# Patient Record
Sex: Female | Born: 1960 | Race: Black or African American | Hispanic: No | Marital: Married | Smoking: Never smoker
Health system: Southern US, Community
[De-identification: ages and names within clinical notes are randomized; demographics above are authoritative.]

## PROBLEM LIST (undated history)

## (undated) DIAGNOSIS — R079 Chest pain, unspecified: Secondary | ICD-10-CM

## (undated) DIAGNOSIS — I10 Essential (primary) hypertension: Secondary | ICD-10-CM

## (undated) DIAGNOSIS — K7689 Other specified diseases of liver: Secondary | ICD-10-CM

## (undated) DIAGNOSIS — Z531 Procedure and treatment not carried out because of patient's decision for reasons of belief and group pressure: Secondary | ICD-10-CM

## (undated) DIAGNOSIS — I341 Nonrheumatic mitral (valve) prolapse: Secondary | ICD-10-CM

## (undated) DIAGNOSIS — IMO0001 Reserved for inherently not codable concepts without codable children: Secondary | ICD-10-CM

## (undated) DIAGNOSIS — M8580 Other specified disorders of bone density and structure, unspecified site: Secondary | ICD-10-CM

## (undated) HISTORY — DX: Nonrheumatic mitral (valve) prolapse: I34.1

## (undated) HISTORY — PX: WISDOM TOOTH EXTRACTION: SHX21

## (undated) HISTORY — DX: Reserved for inherently not codable concepts without codable children: IMO0001

## (undated) HISTORY — DX: Procedure and treatment not carried out because of patient's decision for reasons of belief and group pressure: Z53.1

## (undated) HISTORY — DX: Other specified disorders of bone density and structure, unspecified site: M85.80

---
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
## 1998-09-06 ENCOUNTER — Ambulatory Visit (HOSPITAL_COMMUNITY): Admission: RE | Admit: 1998-09-06 | Discharge: 1998-09-06 | Payer: Self-pay | Admitting: Family Medicine

## 2001-03-23 ENCOUNTER — Other Ambulatory Visit: Admission: RE | Admit: 2001-03-23 | Discharge: 2001-03-23 | Payer: Self-pay | Admitting: Obstetrics and Gynecology

## 2002-05-11 ENCOUNTER — Encounter: Admission: RE | Admit: 2002-05-11 | Discharge: 2002-05-11 | Payer: Self-pay | Admitting: Family Medicine

## 2002-05-11 ENCOUNTER — Encounter: Payer: Self-pay | Admitting: Family Medicine

## 2003-05-02 ENCOUNTER — Encounter: Payer: Self-pay | Admitting: Obstetrics and Gynecology

## 2003-05-02 ENCOUNTER — Encounter: Admission: RE | Admit: 2003-05-02 | Discharge: 2003-05-02 | Payer: Self-pay | Admitting: Obstetrics and Gynecology

## 2003-05-17 ENCOUNTER — Encounter: Admission: RE | Admit: 2003-05-17 | Discharge: 2003-05-17 | Payer: Self-pay | Admitting: Family Medicine

## 2003-05-17 ENCOUNTER — Encounter: Payer: Self-pay | Admitting: Family Medicine

## 2003-06-09 ENCOUNTER — Encounter: Admission: RE | Admit: 2003-06-09 | Discharge: 2003-06-09 | Payer: Self-pay | Admitting: Family Medicine

## 2003-09-14 ENCOUNTER — Encounter: Admission: RE | Admit: 2003-09-14 | Discharge: 2003-09-14 | Payer: Self-pay | Admitting: Family Medicine

## 2003-12-06 ENCOUNTER — Ambulatory Visit (HOSPITAL_COMMUNITY): Admission: RE | Admit: 2003-12-06 | Discharge: 2003-12-06 | Payer: Self-pay | Admitting: Gastroenterology

## 2004-03-28 ENCOUNTER — Encounter: Admission: RE | Admit: 2004-03-28 | Discharge: 2004-03-28 | Payer: Self-pay | Admitting: Family Medicine

## 2004-06-27 ENCOUNTER — Encounter: Admission: RE | Admit: 2004-06-27 | Discharge: 2004-06-27 | Payer: Self-pay | Admitting: Obstetrics and Gynecology

## 2005-07-17 ENCOUNTER — Encounter: Admission: RE | Admit: 2005-07-17 | Discharge: 2005-07-17 | Payer: Self-pay | Admitting: Obstetrics and Gynecology

## 2005-09-28 ENCOUNTER — Emergency Department (HOSPITAL_COMMUNITY): Admission: EM | Admit: 2005-09-28 | Discharge: 2005-09-28 | Payer: Self-pay | Admitting: Emergency Medicine

## 2006-02-14 ENCOUNTER — Other Ambulatory Visit: Admission: RE | Admit: 2006-02-14 | Discharge: 2006-02-14 | Payer: Self-pay | Admitting: Family Medicine

## 2006-06-26 ENCOUNTER — Encounter: Admission: RE | Admit: 2006-06-26 | Discharge: 2006-06-26 | Payer: Self-pay | Admitting: Family Medicine

## 2006-07-18 ENCOUNTER — Encounter: Admission: RE | Admit: 2006-07-18 | Discharge: 2006-07-18 | Payer: Self-pay | Admitting: Family Medicine

## 2007-06-22 ENCOUNTER — Encounter: Admission: RE | Admit: 2007-06-22 | Discharge: 2007-06-22 | Payer: Self-pay | Admitting: Family Medicine

## 2007-07-21 ENCOUNTER — Encounter: Admission: RE | Admit: 2007-07-21 | Discharge: 2007-07-21 | Payer: Self-pay | Admitting: Family Medicine

## 2007-11-27 HISTORY — PX: VAGINAL HYSTERECTOMY: SUR661

## 2007-12-14 ENCOUNTER — Encounter: Payer: Self-pay | Admitting: Gynecology

## 2007-12-14 ENCOUNTER — Ambulatory Visit (HOSPITAL_BASED_OUTPATIENT_CLINIC_OR_DEPARTMENT_OTHER): Admission: RE | Admit: 2007-12-14 | Discharge: 2007-12-15 | Payer: Self-pay | Admitting: Gynecology

## 2008-06-05 ENCOUNTER — Encounter: Admission: RE | Admit: 2008-06-05 | Discharge: 2008-06-05 | Payer: Self-pay | Admitting: Family Medicine

## 2008-07-27 ENCOUNTER — Encounter: Admission: RE | Admit: 2008-07-27 | Discharge: 2008-07-27 | Payer: Self-pay | Admitting: Family Medicine

## 2009-01-10 ENCOUNTER — Other Ambulatory Visit: Admission: RE | Admit: 2009-01-10 | Discharge: 2009-01-10 | Payer: Self-pay | Admitting: Gynecology

## 2009-01-10 ENCOUNTER — Ambulatory Visit: Payer: Self-pay | Admitting: Gynecology

## 2009-01-10 ENCOUNTER — Encounter: Payer: Self-pay | Admitting: Gynecology

## 2009-08-16 ENCOUNTER — Encounter: Admission: RE | Admit: 2009-08-16 | Discharge: 2009-08-16 | Payer: Self-pay | Admitting: Gynecology

## 2010-01-11 ENCOUNTER — Ambulatory Visit: Payer: Self-pay | Admitting: Gynecology

## 2010-01-11 ENCOUNTER — Other Ambulatory Visit: Admission: RE | Admit: 2010-01-11 | Discharge: 2010-01-11 | Payer: Self-pay | Admitting: Gynecology

## 2010-06-28 DIAGNOSIS — M8580 Other specified disorders of bone density and structure, unspecified site: Secondary | ICD-10-CM

## 2010-06-28 HISTORY — DX: Other specified disorders of bone density and structure, unspecified site: M85.80

## 2010-09-05 ENCOUNTER — Other Ambulatory Visit: Payer: Self-pay | Admitting: Gynecology

## 2010-09-05 DIAGNOSIS — Z1231 Encounter for screening mammogram for malignant neoplasm of breast: Secondary | ICD-10-CM

## 2010-09-10 ENCOUNTER — Ambulatory Visit
Admission: RE | Admit: 2010-09-10 | Discharge: 2010-09-10 | Disposition: A | Discharge status: Home / Self Care | Payer: BLUE CROSS/BLUE SHIELD | Source: Ambulatory Visit | Attending: Gynecology | Admitting: Gynecology

## 2010-09-10 DIAGNOSIS — Z1231 Encounter for screening mammogram for malignant neoplasm of breast: Secondary | ICD-10-CM

## 2010-12-11 NOTE — Op Note (Signed)
NAME:  Kathy Douglas, Kathy Douglas                ACCOUNT NO.:  [id number]   MEDICAL RECORD NO.:  [id number]          PATIENT TYPE:  AMB   LOCATION:  [facility]                         FACILITY:  [facility]   PHYSICIAN:  Timothy P. Fontaine, M.D.DATE OF BIRTH:  [birth year]   DATE OF PROCEDURE:  12/14/2007  DATE OF DISCHARGE:                               OPERATIVE REPORT   PREOPERATIVE DIAGNOSES:  1. Leiomyoma.  2. menorrhagia.  3. Dysmenorrhea.  4. Dyspareunia.   POSTOPERATIVE DIAGNOSES:  1. Leiomyoma.  2. menorrhagia.  3. Dysmenorrhea.  4. Dyspareunia.   PROCEDURE:  Laparoscopically-assisted vaginal hysterectomy and bilateral  salpingo-oophorectomy.   SURGEON:  Timothy P. Fontaine, M.D.   ASSISTANTGaetano Hawthorne. Lily Peer, M.D.   ANESTHETIC:  General.   ESTIMATED BLOOD LOSS:  200 mL.   COMPLICATIONS:  None.   SPECIMENS:  Uterus, right and left fallopian tube segments, right and  left ovaries to pathology.   FINDINGS:  EUA, external BUS and vagina normal.  Cervix grossly normal.  Uterus bulky, approximately 12-week size and irregular.  Adnexa without  gross masses.  Surgical uterus enlarged with multiple myomas.  The right  and left fallopian tube segments were grossly normal.  Right and left  ovaries were grossly normal.  Pelvis otherwise unremarkable evidence of  adhesive disease or endometriosis.  Upper abdominal exam was grossly  normal.   PROCEDURE:  The patient was taken to the operating room; underwent  general endotracheal anesthesia.  She was placed in the low dorsal  lithotomy position, received an abdominal perineal vaginal preparation  with Betadine solution.  EUA performed.  Hulka tenaculum placed on the  cervix and an indwelling Foley catheterization placed in sterile  technique.  The patient was draped in the usual fashion.  A repeat  transverse infraumbilical incision was made.  The 10-mm OptiVu trocar  with laparoscope was placed under direct visualization without  difficulty, and the abdomen was subsequently insufflated.  Right and  left 5-mm suprapubic ports were then placed under direct visualization,  after transillumination for the vessels without difficulty.  Examination  of pelvic organs and upper abdominal exam was carried out with findings  noted above.  The left infundibulopelvic ligament and vessels were  identified.  The pelvic sidewall anatomy reviewed; the ureter found to  be away from the operative field.  The infundibulopelvic ligament and  vessels were transected using the harmonic scalpel without difficulty.  The left adnexa was progressively freed from its attachments through  transection with the harmonic scalpel, to the level of the round  ligament.  The round ligament was then transected and the anterior  vesicouterine peritoneal fold was sharply developed with the harmonic  scalpel to the midline.  A similar procedure was then carried out on the  other side, meeting the midline peritoneal incision anteriorly.  The  parametrial tissues were skeletonized down to the level of the uterine  vessels, which were left intact.   Attention was then turned to the vaginal portion of the procedure.  The  patient was then placed in the high lithotomy position.  A weighted  speculum  was placed.  The cervix was visualized.  The Hulka tenaculum  removed and replaced with a Jacobs tenaculum.  The cervical mucosa was  then circumferentially injected using lidocaine and epinephrine mixture.  The cervical mucosa was then sharply incised circumferentially and the  paracervical planes were sharply developed.  The anterior cul-de-sac was  then entered, and subsequently the posterior cul-de-sac was entered  without difficulty and a long, weighted speculum placed.  The right and  left uterosacral ligaments were then identified, clamped, cut and  ligated using zero Vicryl suture; tagged for future reference.  The  uterus was progressively freed from its  attachments through clamping,  cutting and ligating of the paracervical cardinal ligaments and  parametrial tissues, using zero Vicryl suture to the level of the  uterine vessels; which were identified and doubly ligated and  transected.  Due to the size of the uterus, it was necessary to  morcellate the uterus to remove it vaginally.  The cervix was cored and  removed, and subsequent morcellation of the uterus allowed delivery of  the remaining segments.  The remaining parametrial attachments were  clamped, cut and ligated using zero Vicryl suture, and the specimen was  removed in its entirety.  The long weighted speculum was replaced with a  short weighted speculum.  The posterior vaginal cuff was grasped with an  Allis clamp.  The intestines were packed from the operative field with a  moist sponge tape.  Then the posterior vaginal mucosa cuff region was  run from uterosacral ligament to uterosacral ligament using zero Vicryl  suture in a running interlocking stitch.  The pelvis was irrigated.  Hemostasis was visualized.  The sponge was removed.  The vagina was  closed anterior to posterior using zero Vicryl suture in interrupted  figure-of-eight stitch.  The vagina was irrigated.  Hemostasis  visualized.   Attention was then returned to the abdominal portion.  The  patient was  placed in the low dorsal lithotomy position; the operating team  regloved.  The abdomen was reinsufflated and the pelvis was copiously  irrigated; showing adequate hemostasis at all surgical sites and  pedicles.  The gas was slowly allowed to escape.  The right and left 5-  mm ports were removed.  Hemostasis again was visualized at the surgical  sites and the port sites under direct visualization under low pressure  situation.  The infraumbilical port was then backed out under direct  visualization, showing adequate hemostasis and no evidence of hernia  formation.  A zero Vicryl interrupted subcutaneous  fascial stitch was  placed infraumbilically.  All skin and sites were injected using 0.25%  Marcaine.  All skin incisions were closed using 4-0 plain interrupted  cuticular stitch.  Sterile dressings were applied.  The patient placed  in the supine position and awakened without difficulty.  She was taken  to the recovery room in good condition, having tolerated the procedure  well, noting clear yellow urine.      Timothy P. Fontaine, M.D.  Electronically Signed     TPF/MEDQ  D:  12/14/2007  T:  12/14/2007  Job:  [id number]

## 2010-12-11 NOTE — Discharge Summary (Signed)
NAME:  KONNER, WARRIOR                ACCOUNT NO.:  [id number]   MEDICAL RECORD NO.:  [id number]          PATIENT TYPE:  AMB   LOCATION:  [facility]                         FACILITY:  [facility]   PHYSICIAN:  Timothy P. Fontaine, M.D.DATE OF BIRTH:  August 01, 1960   DATE OF ADMISSION:  12/14/2007  DATE OF DISCHARGE:  12/15/2007                               DISCHARGE SUMMARY   DISCHARGE DIAGNOSES:  1. Leiomyoma.  2. Menorrhagia.  3. Dysmenorrhea.  4. Dyspareunia.   PROCEDURE:  Laparoscopically-assisted vaginal hysterectomy, bilateral  salpingo-oophorectomy, Dec 14, 2007, pathology pending.   HOSPITAL COURSE:  A 50 year old female with leiomyoma, menorrhagia,  dysmenorrhea, dyspareunia.  Underwent uncomplicated LAVH-BSO Dec 14, 2007.  The patient's postoperative course was uncomplicated and she was  discharged home postoperative day #1 ambulating well, tolerating a  regular diet, voiding without difficulty.  The patient's preoperative  hemoglobin was 12, her postoperative hemoglobin was 10.  She received  precautions, instructions and follow-up.  Will be seen in the office 2  weeks following discharge and already has pain medications of Tylox at  home and will take 1-2 p.o. q.4-6h. p.r.n. pain.      Timothy P. Fontaine, M.D.  Electronically Signed     TPF/MEDQ  D:  12/15/2007  T:  12/15/2007  Job:  [id number]

## 2010-12-11 NOTE — H&P (Signed)
NAME:  Kathy Douglas, Kathy Douglas                ACCOUNT NO.:  [id number]   MEDICAL RECORD NO.:  [id number]          PATIENT TYPE:  AMB   LOCATION:  [facility]                         FACILITY:  [facility]   PHYSICIAN:  Timothy P. Fontaine, M.D.DATE OF BIRTH:  07-31-1960   DATE OF ADMISSION:  12/14/2007  DATE OF DISCHARGE:                              HISTORY & PHYSICAL   CHIEF COMPLAINT:  Menorrhagia, iron deficiency anemia, leiomyoma,  dysmenorrhea, dyspareunia.   HISTORY OF PRESENT ILLNESS:  A 50 year old G2, P2 female status post  tubal sterilization, history of leiomyoma, menorrhagia, iron deficiency  anemia, dyspareunia, dysmenorrhea for LAVH BSO.  She has been found to  have a 10-12 week size uterus on exam, a negative endometrial biopsy,  and a negative sonohystogram for intracavitary abnormalities.  Options  for management were reviewed with the patient to include hormonal  suppression, Marina IUD, endometrial ablation, uterine artery  embolization, hysterectomy, TAH, LAVH, TVH were all reviewed.  She is  admitted for LAVH BSO.  She has been on Lupron suppression to allow for  hemoglobin recovery.  She initially had a hemoglobin of 9, and her last  hemoglobin was 11.8.  Her most recent is pending at the time of this  dictation.   PAST MEDICAL HISTORY:  Uncomplicated.   PAST SURGICAL HISTORY:  Tubal sterilization.   ALLERGIES:  NO KNOWN DRUG ALLERGIES.   CURRENT MEDICATIONS:  Iron.   SOCIAL HISTORY:  Significant for Jehovah's Witness, refuses blood  products.   FAMILY HISTORY:  Noncontributory.   ADMISSION PHYSICAL EXAMINATION:  VITAL SIGNS:  Afebrile, vital signs  stable.  HEENT:  Normal.  LUNGS:  Clear.  CARDIAC:  Regular rate.  No rubs, murmurs, or gallops.  ABDOMEN:  Benign.  PELVIC:  External BUS, vagina normal.  Cervix normal.  Uterus bulky, 10-  week size, irregular, consistent with leiomyoma.  Adnexa without gross  masses.   ASSESSMENT/PLAN:  A 50 year old gravidaida 1, para 2  female status post  tubal sterilization, leiomyoma, dysmenorrhea, menorrhagia, dyspareunia  for laparoscopic-assisted vaginal hysterectomy with bilateral salpingo-  oophorectomy.  I reviewed the proposed surgery with the patient, the  alternatives, nonsurgical as well as minimally invasive possibilities  including hormonal manipulation, intrauterine device, endometrial  ablation, uterine artery embolization, hysterectomy, myomectomy  reviewed.  She wants to proceed with hysterectomy, bilateral salpingo-  oophorectomy.  I reviewed the ovarian conservation issue, the options of  keeping both ovaries for continued hormone production versus removing  her ovaries were reviewed.  She wants to have both ovaries removed.  I  discussed the issues of hypoestrogenism both from a symptomatic  standpoint as well as a prophylactic cardiovascular standpoint.  The  potential for estrogen replacement therapy was also reviewed and the  [facility] study with its risks, benefits discussed to  include breast cancer, stroke, heart attack, deep venous thrombosis  issues.  The patient, after careful consideration, wants to have both  ovaries removed.  Absolute irreversible sterility associated with  hysterectomy was discussed, understood, and accepted.  Sexuality  following hysterectomy was also reviewed, the potential for orgasmic  dysfunction as well  as persistent dyspareunia was discussed, understood,  and accepted.  She understands the laparoscopic approach, multiple port  site trocar placement, insufflation, use of technologies such as  electrocautery, Harmonic scalpel was reviewed.  The potential for a  larger incision if complications are encountered or if it is felt unsafe  to proceed with the laparoscopic-assisted vaginal hysterectomy approach  was discussed, understood, and accepted.  The risks of infection  requiring prolonged antibiotics as well as abscess formation, opening  and  draining of incisions, abscess formation drainage, reoperation was  all discussed, understood, and accepted.  The long-term issues with  incisions reviewed to include cosmetics, keloid, as well as hernia  formation was all discussed, understood, and accepted.  We again  reviewed on several occasions the issues of her refusal for blood  products.  She will use albumin and non-human alternatives but will not  use platelets or red blood cells.  She will bring and have the  appropriate paperwork with her at the time of surgery.  The risk of  inadvertent injury to internal organs during the procedure to include  bowel, bladder, ureters, vessels and nerves either immediately  recognized or delay recognized, necessitating major exploratory  reparative surgeries, future reparative surgeries, bowel resection,  bladder repair, ureteral surgery, reimplantation, ostomy formation was  all discussed, understood, and accepted.  The patient's questions were  answered to her satisfaction.  She had no questions, and she is ready to  proceed with surgery.      Timothy P. Fontaine, M.D.  Electronically Signed     TPF/MEDQ  D:  12/10/2007  T:  12/10/2007  Job:  [id number]

## 2010-12-14 NOTE — Op Note (Signed)
NAME:  Kathy Douglas, Kathy Douglas                          ACCOUNT NO.:  [id number]   MEDICAL RECORD NO.:  [id number]                   PATIENT TYPE:  AMB   LOCATION:  ENDO                                 FACILITY:  [facility]   PHYSICIAN:  John C. Madilyn Fireman, M.D.                 DATE OF BIRTH:  1961-02-16   DATE OF PROCEDURE:  12/06/2003  DATE OF DISCHARGE:                                 OPERATIVE REPORT   PROCEDURE:  Colonoscopy.   INDICATIONS FOR PROCEDURE:  Chronic left-sided abdominal pain, occasional  rectal bleeding with CT scan suggesting thickening of the descending colon  and splenic flexure area, possibly diverticulitis.   PROCEDURE:  The patient was placed in the left lateral decubitus position  and placed on the pulse monitor with continuous low flow oxygen delivered by  nasal cannula.  She was sedated with 50 mcg IV fentanyl and 6 mg IV Versed.  The video colonoscope was inserted into the rectum and advanced to the  cecum.  Confirmed by transillumination at McBurney's point and visualization  of the ileocecal valve and appendiceal orifice.  Prep is excellent.  The  cecum, ascending, transverse, descending, and sigmoid colon all appeared  normal with no masses, polyps, diverticula, or other mucosal abnormalities.  The rectum likewise appeared normal, and retroflexed view of the anus  revealed only minimal internal hemorrhoids.  The scope was then withdrawn,  and the patient returned to the recovery room in stable condition.  She  tolerated the procedure well, and there were no immediate complications.   IMPRESSION:  Small internal hemorrhoids, otherwise normal study.   PLAN:  1. Treat the hemorrhoids conservatively.  2. Consider ____________.                                               John C. Madilyn Fireman, M.D.    JCH/MEDQ  D:  12/06/2003  T:  12/06/2003  Job:  [id number]   cc:   [place]ale [place]. Andrey Campanile, M.D.  [address]  [place]  [place] [address]  Fax: [phone]

## 2011-01-14 ENCOUNTER — Encounter (INDEPENDENT_AMBULATORY_CARE_PROVIDER_SITE_OTHER): Payer: BLUE CROSS/BLUE SHIELD | Admitting: Gynecology

## 2011-01-14 ENCOUNTER — Other Ambulatory Visit: Payer: Self-pay | Admitting: Gynecology

## 2011-01-14 ENCOUNTER — Other Ambulatory Visit (HOSPITAL_COMMUNITY)
Admission: RE | Admit: 2011-01-14 | Discharge: 2011-01-14 | Disposition: A | Discharge status: Home / Self Care | Payer: BLUE CROSS/BLUE SHIELD | Source: Ambulatory Visit | Attending: Gynecology | Admitting: Gynecology

## 2011-01-14 DIAGNOSIS — Z13220 Encounter for screening for lipoid disorders: Secondary | ICD-10-CM

## 2011-01-14 DIAGNOSIS — Z01419 Encounter for gynecological examination (general) (routine) without abnormal findings: Secondary | ICD-10-CM

## 2011-01-14 DIAGNOSIS — Z833 Family history of diabetes mellitus: Secondary | ICD-10-CM

## 2011-01-14 DIAGNOSIS — Z124 Encounter for screening for malignant neoplasm of cervix: Secondary | ICD-10-CM | POA: Insufficient documentation

## 2011-04-24 LAB — CBC
Hemoglobin: 10.1 — ABNORMAL LOW
MCHC: 33.4
RBC: 3.37 — ABNORMAL LOW

## 2011-04-24 LAB — DIFFERENTIAL
Basophils Relative: 1
Lymphocytes Relative: 10 — ABNORMAL LOW
Monocytes Absolute: 0.3
Monocytes Relative: 4
Neutro Abs: 8.3 — ABNORMAL HIGH

## 2011-10-08 ENCOUNTER — Other Ambulatory Visit: Payer: Self-pay | Admitting: Gynecology

## 2011-10-08 DIAGNOSIS — Z1231 Encounter for screening mammogram for malignant neoplasm of breast: Secondary | ICD-10-CM

## 2011-10-18 ENCOUNTER — Ambulatory Visit
Admission: RE | Admit: 2011-10-18 | Discharge: 2011-10-18 | Disposition: A | Discharge status: Home / Self Care | Payer: BC Managed Care – PPO | Source: Ambulatory Visit | Attending: Gynecology | Admitting: Gynecology

## 2011-10-18 DIAGNOSIS — Z1231 Encounter for screening mammogram for malignant neoplasm of breast: Secondary | ICD-10-CM

## 2012-01-15 ENCOUNTER — Ambulatory Visit (INDEPENDENT_AMBULATORY_CARE_PROVIDER_SITE_OTHER): Payer: BC Managed Care – PPO | Admitting: Gynecology

## 2012-01-15 ENCOUNTER — Encounter: Payer: Self-pay | Admitting: Gynecology

## 2012-01-15 VITALS — BP 120/74 | Ht 67.5 in | Wt 125.0 lb

## 2012-01-15 DIAGNOSIS — Z01419 Encounter for gynecological examination (general) (routine) without abnormal findings: Secondary | ICD-10-CM

## 2012-01-15 DIAGNOSIS — Z79890 Hormone replacement therapy (postmenopausal): Secondary | ICD-10-CM

## 2012-01-15 MED ORDER — ESTRADIOL 0.05 MG/24HR TD PTTW: 1.0000 | MEDICATED_PATCH | TRANSDERMAL | Status: DC

## 2012-01-15 NOTE — Patient Instructions (Signed)
Continue on estrogen patches as discussed. Follow up in one year for annual exam.

## 2012-01-15 NOTE — Progress Notes (Signed)
Kathy Douglas November 11, 1960 [id number]        51 y.o.  for annual exam.  Several issues noted below.  Past medical history,surgical history, medications, allergies, family history and social history were all reviewed and documented in the EPIC chart. ROS:  Was performed and pertinent positives and negatives are included in the history.  Exam: Sherrilyn Rist chaperone present Filed Vitals:   01/15/12 1455  BP: 120/74   General appearance  Normal Skin grossly normal Head/Neck normal with no cervical or supraclavicular adenopathy thyroid normal Lungs  clear Cardiac RR, without RMG Abdominal  soft, nontender, without masses, organomegaly or hernia Breasts  examined lying and sitting without masses, retractions, discharge or axillary adenopathy. Pelvic  Ext/BUS/vagina  normal   Adnexa  Without masses or tenderness    Anus and perineum  normal   Rectovaginal  normal sphincter tone without palpated masses or tenderness.    Assessment/Plan:  51 y.o. female for annual exam.    1. HRT. Patient is on Vivelle dot 0.05 doing well. The issues of weaning and stopping now versus continuing were reviewed. Patient prefers to continue which I think is reasonable through another year or 2. Risks of HRT to include WHI study, increased risk of stroke, heart attack, DVT and breast cancer were reviewed. The ACOG and NAMS statements the lowest dose for shortess period of time reviewed. I refilled her Vivelle dot 0.05 mg. 2. Mammogram. Patient had her mammogram this year we'll continue with annual mammography. SBE monthly reviewed. 3. Pap smear. Last Pap smear 2012. No Pap done today. She has several normal reports chart. I reviewed current screening guidelines and she is status post hysterectomy for benign indications and the options to stop altogether were reviewed. We'll readdress this on an annual basis. 4. Colonoscopy. Patient has had a colonoscopy already in due to be repeated in several years and she'll follow up for  that when she is due. 5. Bone density. She's never had a bone density. Given her young age and being on ERT will plan screening later around age 46. Increase calcium vitamin D reviewed. 6. Health maintenance. Patient has several years and aerobic worth of normal lipid profiles CBCs and glucoses and no blood work was ordered today. A urinalysis was ordered. Assuming she continues well from a gynecologic standpoint she will see me in a year, sooner as needed.    Dara Lords MD, 3:24 PM 01/15/2012

## 2012-01-16 LAB — URINALYSIS W MICROSCOPIC + REFLEX CULTURE
Bacteria, UA: NONE SEEN
Bilirubin Urine: NEGATIVE
Casts: NONE SEEN
Crystals: NONE SEEN
Glucose, UA: NEGATIVE mg/dL
Ketones, ur: NEGATIVE mg/dL
Specific Gravity, Urine: 1.012 (ref 1.005–1.030)
pH: 7 (ref 5.0–8.0)

## 2012-10-19 ENCOUNTER — Other Ambulatory Visit: Payer: Self-pay

## 2012-10-19 DIAGNOSIS — Z1231 Encounter for screening mammogram for malignant neoplasm of breast: Secondary | ICD-10-CM

## 2012-11-06 ENCOUNTER — Ambulatory Visit
Admission: RE | Admit: 2012-11-06 | Discharge: 2012-11-06 | Disposition: A | Discharge status: Home / Self Care | Payer: Self-pay | Source: Ambulatory Visit

## 2012-11-06 DIAGNOSIS — Z1231 Encounter for screening mammogram for malignant neoplasm of breast: Secondary | ICD-10-CM

## 2013-01-18 ENCOUNTER — Encounter: Payer: Self-pay | Admitting: Gynecology

## 2013-01-19 ENCOUNTER — Encounter: Payer: Self-pay | Admitting: Gynecology

## 2013-02-03 ENCOUNTER — Encounter: Payer: Self-pay | Admitting: Gynecology

## 2013-02-03 ENCOUNTER — Ambulatory Visit (INDEPENDENT_AMBULATORY_CARE_PROVIDER_SITE_OTHER): Payer: BC Managed Care – PPO | Admitting: Gynecology

## 2013-02-03 VITALS — BP 120/80 | Ht 67.0 in | Wt 125.0 lb

## 2013-02-03 DIAGNOSIS — Z01419 Encounter for gynecological examination (general) (routine) without abnormal findings: Secondary | ICD-10-CM

## 2013-02-03 DIAGNOSIS — N951 Menopausal and female climacteric states: Secondary | ICD-10-CM

## 2013-02-03 DIAGNOSIS — Z13220 Encounter for screening for lipoid disorders: Secondary | ICD-10-CM

## 2013-02-03 DIAGNOSIS — Z79890 Hormone replacement therapy (postmenopausal): Secondary | ICD-10-CM

## 2013-02-03 LAB — CBC WITH DIFFERENTIAL/PLATELET
Basophils Absolute: 0 10*3/uL (ref 0.0–0.1)
Basophils Relative: 0 % (ref 0–1)
Eosinophils Absolute: 0 10*3/uL (ref 0.0–0.7)
MCHC: 32.7 g/dL (ref 30.0–36.0)
Neutro Abs: 2.9 10*3/uL (ref 1.7–7.7)
Neutrophils Relative %: 69 % (ref 43–77)
RDW: 13.6 % (ref 11.5–15.5)

## 2013-02-03 LAB — LIPID PANEL
HDL: 88 mg/dL (ref >39)
LDL Cholesterol: 66 mg/dL (ref 0–99)
VLDL: 10 mg/dL (ref 0–40)

## 2013-02-03 LAB — TSH: TSH: 0.861 u[IU]/mL (ref 0.350–4.500)

## 2013-02-03 LAB — COMPREHENSIVE METABOLIC PANEL
AST: 18 U/L (ref 0–37)
Albumin: 4.7 g/dL (ref 3.5–5.2)
Alkaline Phosphatase: 43 U/L (ref 39–117)
BUN: 14 mg/dL (ref 6–23)
Potassium: 4.1 mEq/L (ref 3.5–5.3)
Sodium: 140 mEq/L (ref 135–145)
Total Bilirubin: 0.4 mg/dL (ref 0.3–1.2)
Total Protein: 7.2 g/dL (ref 6.0–8.3)

## 2013-02-03 MED ORDER — ESTRADIOL 0.05 MG/24HR TD PTTW: 1.0000 | MEDICATED_PATCH | TRANSDERMAL | Status: DC

## 2013-02-03 NOTE — Progress Notes (Signed)
Kathy Douglas 1961/07/14 [id number]        53 y.o.  G2P2 for annual exam.  Doing well.  Past medical history,surgical history, medications, allergies, family history and social history were all reviewed and documented in the EPIC chart.  ROS:  Performed and pertinent positives and negatives are included in the history, assessment and plan .  Exam: Charity fundraiser Vitals:   02/03/13 1429  BP: 120/80  Height: 5\' 7"  (1.702 m)  Weight: 125 lb (56.7 kg)   General appearance  Normal Skin grossly normal Head/Neck normal with no cervical or supraclavicular adenopathy thyroid normal Lungs  clear Cardiac RR, without RMG Abdominal  soft, nontender, without masses, organomegaly or hernia Breasts  examined lying and sitting without masses, retractions, discharge or axillary adenopathy. Pelvic  Ext/BUS/vagina  normal with mild atrophic changes  Adnexa  Without masses or tenderness    Anus and perineum  normal   Rectovaginal  normal sphincter tone without palpated masses or tenderness.    Assessment/Plan:  52 y.o. G2P2 female for annual exam.   1. HRT. Patient is on Vivelle 0.05 mg patch. She actually ran out a month ago and notes exacerbation of her hot flashes and sweats and finds this intolerable.  I again reviewed the whole issue of HRT with her to include the WHI study with increased risk of stroke, heart attack, DVT and breast cancer. The ACOG and NAMS statements for lowest dose for the shortest period of time reviewed. Transdermal versus oral first-pass effect benefit discussed. I refilled her estradiol 0.05 mg patches x1 year. 2. Mammography April 2014. Continued annual mammography. 3. Pap smear 2012. Pap smear done today. History of hysterectomy for benign indications. No history of abnormal Pap smears previously. Options to stop screening altogether or less frequent screening intervals reviewed. We'll readdress on an annual basis. 4. Colonoscopy 9 years ago. Patient is to  scheduled this coming year and agrees to do so. 5. DEXA 2012. Done at orthopedics office. Was told there was some weakness but not significant. I asked her to get me a copy of this so I can review this and will decide when to repeat her DEXA. Increase calcium and vitamin D reviewed. Check vitamin D level today. 6. Health maintenance. Baseline CBC comprehensive metabolic panel lipid profile TSH vitamin D urinalysis ordered. Followup one year, sooner as needed.  Note: This document was prepared with digital dictation and possible smart phrase technology. Any transcriptional errors that result from this process are unintentional.   Dara Lords MD, 3:07 PM 02/03/2013

## 2013-02-03 NOTE — Patient Instructions (Signed)
Get me a copy of your bone density study. Followup in one year, sooner as needed.

## 2013-02-04 LAB — URINALYSIS W MICROSCOPIC + REFLEX CULTURE
Bilirubin Urine: NEGATIVE
Glucose, UA: NEGATIVE mg/dL
Hgb urine dipstick: NEGATIVE
Protein, ur: NEGATIVE mg/dL
Squamous Epithelial / LPF: NONE SEEN
Urobilinogen, UA: 0.2 mg/dL (ref 0.0–1.0)

## 2013-02-05 ENCOUNTER — Other Ambulatory Visit: Payer: Self-pay | Admitting: Gynecology

## 2013-02-05 MED ORDER — SULFAMETHOXAZOLE-TMP DS 800-160 MG PO TABS: 1.0000 | ORAL_TABLET | Freq: Two times a day (BID) | ORAL | Status: DC

## 2013-02-06 LAB — URINE CULTURE: Colony Count: >=100000

## 2013-03-10 ENCOUNTER — Encounter: Payer: Self-pay | Admitting: Gynecology

## 2013-03-23 ENCOUNTER — Encounter: Payer: Self-pay | Admitting: Gynecology

## 2013-03-31 ENCOUNTER — Other Ambulatory Visit: Payer: Self-pay | Admitting: Gynecology

## 2013-03-31 DIAGNOSIS — Z13820 Encounter for screening for osteoporosis: Secondary | ICD-10-CM

## 2013-05-04 ENCOUNTER — Ambulatory Visit (INDEPENDENT_AMBULATORY_CARE_PROVIDER_SITE_OTHER): Payer: BC Managed Care – PPO

## 2013-05-04 DIAGNOSIS — Z13820 Encounter for screening for osteoporosis: Secondary | ICD-10-CM

## 2013-06-03 ENCOUNTER — Other Ambulatory Visit: Payer: Self-pay

## 2013-10-01 ENCOUNTER — Emergency Department (HOSPITAL_COMMUNITY): Payer: BC Managed Care – PPO

## 2013-10-01 ENCOUNTER — Observation Stay (HOSPITAL_COMMUNITY)
Admission: EM | Admit: 2013-10-01 | Discharge: 2013-10-02 | Disposition: A | Discharge status: Home / Self Care | Payer: BC Managed Care – PPO | Attending: Internal Medicine | Admitting: Internal Medicine

## 2013-10-01 ENCOUNTER — Encounter (HOSPITAL_COMMUNITY)
Admission: EM | Disposition: A | Discharge status: Home / Self Care | Payer: Self-pay | Source: Home / Self Care | Attending: Emergency Medicine

## 2013-10-01 ENCOUNTER — Encounter (HOSPITAL_COMMUNITY): Payer: Self-pay | Admitting: Emergency Medicine

## 2013-10-01 DIAGNOSIS — R079 Chest pain, unspecified: Secondary | ICD-10-CM

## 2013-10-01 DIAGNOSIS — Z8249 Family history of ischemic heart disease and other diseases of the circulatory system: Secondary | ICD-10-CM | POA: Insufficient documentation

## 2013-10-01 DIAGNOSIS — K7689 Other specified diseases of liver: Secondary | ICD-10-CM | POA: Insufficient documentation

## 2013-10-01 DIAGNOSIS — M949 Disorder of cartilage, unspecified: Secondary | ICD-10-CM

## 2013-10-01 DIAGNOSIS — R0789 Other chest pain: Principal | ICD-10-CM | POA: Insufficient documentation

## 2013-10-01 DIAGNOSIS — E876 Hypokalemia: Secondary | ICD-10-CM | POA: Insufficient documentation

## 2013-10-01 DIAGNOSIS — Z531 Procedure and treatment not carried out because of patient's decision for reasons of belief and group pressure: Secondary | ICD-10-CM | POA: Insufficient documentation

## 2013-10-01 DIAGNOSIS — I200 Unstable angina: Secondary | ICD-10-CM | POA: Insufficient documentation

## 2013-10-01 DIAGNOSIS — M899 Disorder of bone, unspecified: Secondary | ICD-10-CM | POA: Insufficient documentation

## 2013-10-01 HISTORY — PX: LEFT HEART CATHETERIZATION WITH CORONARY ANGIOGRAM: SHX5451

## 2013-10-01 HISTORY — DX: Chest pain, unspecified: R07.9

## 2013-10-01 HISTORY — DX: Other specified diseases of liver: K76.89

## 2013-10-01 LAB — HEPATIC FUNCTION PANEL
ALK PHOS: 41 U/L (ref 39–117)
ALT: 10 U/L (ref 0–35)
AST: 17 U/L (ref 0–37)
Albumin: 3.4 g/dL — ABNORMAL LOW (ref 3.5–5.2)
BILIRUBIN TOTAL: 0.3 mg/dL (ref 0.3–1.2)
Bilirubin, Direct: <0.2 mg/dL (ref 0.0–0.3)
Total Protein: 6.4 g/dL (ref 6.0–8.3)

## 2013-10-01 LAB — BASIC METABOLIC PANEL
BUN: 16 mg/dL (ref 6–23)
CO2: 26 mEq/L (ref 19–32)
CREATININE: 1.17 mg/dL — AB (ref 0.50–1.10)
Calcium: 9.1 mg/dL (ref 8.4–10.5)
Chloride: 101 mEq/L (ref 96–112)
GFR calc non Af Amer: 53 mL/min — ABNORMAL LOW (ref >90)
GFR, EST AFRICAN AMERICAN: 61 mL/min — AB (ref >90)
Glucose, Bld: 83 mg/dL (ref 70–99)
Potassium: 3.6 mEq/L — ABNORMAL LOW (ref 3.7–5.3)
Sodium: 139 mEq/L (ref 137–147)

## 2013-10-01 LAB — TROPONIN I
Troponin I: <0.3 ng/mL (ref <0.30)
Troponin I: <0.3 ng/mL (ref <0.30)
Troponin I: <0.3 ng/mL (ref <0.30)
Troponin I: <0.3 ng/mL (ref <0.30)

## 2013-10-01 LAB — D-DIMER, QUANTITATIVE (NOT AT ARMC): D DIMER QUANT: 0.58 ug{FEU}/mL — AB (ref 0.00–0.48)

## 2013-10-01 LAB — I-STAT TROPONIN, ED: Troponin i, poc: 0 ng/mL (ref 0.00–0.08)

## 2013-10-01 LAB — CBC
HEMATOCRIT: 37.8 % (ref 36.0–46.0)
Hemoglobin: 12.6 g/dL (ref 12.0–15.0)
MCH: 30.9 pg (ref 26.0–34.0)
MCHC: 33.3 g/dL (ref 30.0–36.0)
MCV: 92.6 fL (ref 78.0–100.0)
Platelets: 172 10*3/uL (ref 150–400)
RBC: 4.08 MIL/uL (ref 3.87–5.11)
RDW: 12.8 % (ref 11.5–15.5)
WBC: 5 10*3/uL (ref 4.0–10.5)

## 2013-10-01 LAB — LIPID PANEL
CHOL/HDL RATIO: 1.6 ratio
Cholesterol: 144 mg/dL (ref 0–200)
HDL: 89 mg/dL (ref >39)
LDL CALC: 50 mg/dL (ref 0–99)
Triglycerides: 25 mg/dL (ref <150)
VLDL: 5 mg/dL (ref 0–40)

## 2013-10-01 LAB — HEPARIN LEVEL (UNFRACTIONATED): HEPARIN UNFRACTIONATED: 0.35 [IU]/mL (ref 0.30–0.70)

## 2013-10-01 LAB — PROTIME-INR
INR: 0.96 (ref 0.00–1.49)
PROTHROMBIN TIME: 12.6 s (ref 11.6–15.2)

## 2013-10-01 SURGERY — LEFT HEART CATHETERIZATION WITH CORONARY ANGIOGRAM
Anesthesia: LOCAL

## 2013-10-01 MED ORDER — NITROGLYCERIN 0.2 MG/ML ON CALL CATH LAB
INTRAVENOUS | Status: AC
Start: 2013-10-01 — End: 2013-10-01
  Filled 2013-10-01: qty 1

## 2013-10-01 MED ORDER — ASPIRIN 325 MG PO TABS
325.0000 mg | ORAL_TABLET | ORAL | Status: AC
  Administered 2013-10-01: 325 mg via ORAL
  Filled 2013-10-01: qty 1

## 2013-10-01 MED ORDER — FENTANYL CITRATE 0.05 MG/ML IJ SOLN
INTRAMUSCULAR | Status: AC
  Filled 2013-10-01: qty 2

## 2013-10-01 MED ORDER — MIDAZOLAM HCL 2 MG/2ML IJ SOLN
INTRAMUSCULAR | Status: AC
  Filled 2013-10-01: qty 2

## 2013-10-01 MED ORDER — POTASSIUM CHLORIDE CRYS ER 20 MEQ PO TBCR: 40.0000 meq | EXTENDED_RELEASE_TABLET | Freq: Once | ORAL | Status: DC

## 2013-10-01 MED ORDER — NITROGLYCERIN 0.4 MG SL SUBL
0.4000 mg | SUBLINGUAL_TABLET | SUBLINGUAL | Status: DC | PRN
  Administered 2013-10-01 (×3): 0.4 mg via SUBLINGUAL

## 2013-10-01 MED ORDER — NITROGLYCERIN 0.4 MG SL SUBL: 0.4000 mg | SUBLINGUAL_TABLET | SUBLINGUAL | Status: DC | PRN

## 2013-10-01 MED ORDER — MORPHINE SULFATE 4 MG/ML IJ SOLN
4.0000 mg | Freq: Once | INTRAMUSCULAR | Status: DC
Start: 2013-10-01 — End: 2013-10-01
  Filled 2013-10-01: qty 1

## 2013-10-01 MED ORDER — HEPARIN BOLUS VIA INFUSION
2500.0000 [IU] | Freq: Once | INTRAVENOUS | Status: AC
  Administered 2013-10-01: 2500 [IU] via INTRAVENOUS
  Filled 2013-10-01: qty 2500

## 2013-10-01 MED ORDER — SODIUM CHLORIDE 0.9 % IV SOLN: 1.0000 mL/kg/h | INTRAVENOUS | Status: AC

## 2013-10-01 MED ORDER — NITROGLYCERIN 2 % TD OINT
1.0000 [in_us] | TOPICAL_OINTMENT | Freq: Four times a day (QID) | TRANSDERMAL | Status: DC | PRN
  Administered 2013-10-01: 1 [in_us] via TOPICAL
  Filled 2013-10-01: qty 1

## 2013-10-01 MED ORDER — HEPARIN (PORCINE) IN NACL 100-0.45 UNIT/ML-% IJ SOLN
650.0000 [IU]/h | INTRAMUSCULAR | Status: DC
  Administered 2013-10-01: 650 [IU]/h via INTRAVENOUS
  Filled 2013-10-01: qty 250

## 2013-10-01 MED ORDER — ACETAMINOPHEN 325 MG PO TABS: 650.0000 mg | ORAL_TABLET | ORAL | Status: DC | PRN

## 2013-10-01 MED ORDER — VERAPAMIL HCL 2.5 MG/ML IV SOLN
INTRAVENOUS | Status: AC
  Filled 2013-10-01: qty 2

## 2013-10-01 MED ORDER — HEPARIN (PORCINE) IN NACL 2-0.9 UNIT/ML-% IJ SOLN
INTRAMUSCULAR | Status: AC
  Filled 2013-10-01: qty 1000

## 2013-10-01 MED ORDER — ADULT MULTIVITAMIN W/MINERALS CH
1.0000 | ORAL_TABLET | Freq: Every day | ORAL | Status: DC
  Filled 2013-10-01: qty 1

## 2013-10-01 MED ORDER — ASPIRIN EC 81 MG PO TBEC
81.0000 mg | DELAYED_RELEASE_TABLET | Freq: Every day | ORAL | Status: DC
  Filled 2013-10-01: qty 1

## 2013-10-01 MED ORDER — IOHEXOL 350 MG/ML SOLN
65.0000 mL | Freq: Once | INTRAVENOUS | Status: AC | PRN
  Administered 2013-10-01: 65 mL via INTRAVENOUS

## 2013-10-01 MED ORDER — POTASSIUM CHLORIDE CRYS ER 20 MEQ PO TBCR
20.0000 meq | EXTENDED_RELEASE_TABLET | Freq: Once | ORAL | Status: AC
  Administered 2013-10-01: 20 meq via ORAL
  Filled 2013-10-01: qty 1

## 2013-10-01 MED ORDER — LIDOCAINE HCL (PF) 1 % IJ SOLN
INTRAMUSCULAR | Status: AC
  Filled 2013-10-01: qty 30

## 2013-10-01 MED ORDER — SODIUM CHLORIDE 0.9 % IV SOLN
INTRAVENOUS | Status: DC
  Administered 2013-10-01: 55 mL/h via INTRAVENOUS

## 2013-10-01 MED ORDER — HEPARIN SODIUM (PORCINE) 1000 UNIT/ML IJ SOLN
INTRAMUSCULAR | Status: AC
  Filled 2013-10-01: qty 1

## 2013-10-01 NOTE — Progress Notes (Signed)
Utilization Review Completed.Kathy Douglas T3/12/2013  

## 2013-10-01 NOTE — H&P (Signed)
History and Physical  Patient ID: Kathy Douglas MRN: [id number], DOB: Sep 27, 1960 Date of Encounter: 10/01/2013, 8:31 AM Primary Physician: Leanor Rubenstein, MD Primary Cardiologist: New  Chief Complaint: CP Reason for Admission: CP  HPI: Kathy Douglas is a 53 y/o nonsmoking F Jehovah's witness with no pertinent PMH but a family history of CAD (brother had MI in his 72's, father with MI age 43) who presented to [facility] today with chest pain. 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 She awoke in the middle of the night right around 3am with chest discomfort described as dull with a hot flushed sensation across her chest and left arm with associated left arm tingling. She had mild nausea but no vomiting, palpitations or diaphoresis. She changed positions and sat up but pain did not improve. Walking to the bathroom and standing up helped improve the pain somewhat. However, it did not improve so she had her husband drive her to the ER. She was given full dose aspirin. She received 3 SL NTG which brought down pain from 6/10 to 2/10 after the 3rd NTG. This did not completely relieve the discomfort however and pain began to creep back up. It is presently a 4/10 and she has declined further pain medicine for this. Initial troponin neg x 2. Pain is not worse with inspiration, palpation, movement. No recent postprandial pain or any other unusual sx. D-dimer was 0.58 - CT undertaken without evidence of acute PE or acute findings. There were multiple simple appearing cysts in the liver. VSS.  Past Medical History  Diagnosis Date  . Refusal of blood transfusions as patient is Jehovah's Witness     Jehovah witness  . Osteopenia 06/2010    T score -1.44 of the hip     Most Recent Cardiac Studies: None   Surgical History:  Past Surgical History  Procedure Laterality Date  . Tubal ligation  1975  . Vaginal hysterectomy   11/2007    LAVH,BSO Leiomyomata/Menorrhagia  . Wisdom tooth extraction       Home Meds: Prior to Admission medications   Medication Sig Start Date End Date Taking? Authorizing Provider  estradiol (VIVELLE-DOT) 0.05 MG/24HR Place 1 patch (0.05 mg total) onto the skin 2 (two) times a week. 02/03/13  Yes Dara Lords, MD  Multiple Vitamins-Iron (MULTIVITAMIN/IRON PO) Take by mouth.     Yes Historical Provider, MD    Allergies: No Known Allergies  History   Social History  . Marital Status: Married    Spouse Name: N/A    Number of Children: N/A  . 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 Smoking status: Never Smoker   . Smokeless tobacco: Never Used  . Alcohol Use: No  . Drug Use: No  . Sexual Activity: Yes   Other Topics Concern  . Not on file   Social History Narrative  . No narrative on file     Family History  Problem Relation Age of Onset  . Hypertension Mother   . Hypertension Father   . Heart disease Father     Heart attack at 29 (passed) after surgery  . Heart disease Brother     First heart attack age 60, also has CHF  . Cancer Brother   . Diabetes Paternal Uncle   .  Cancer Maternal Aunt     prostate  . Hypertension      Multiple siblings    Review of Systems: General: negative for chills, fever, night sweats or weight changes. "stays cold" Cardiovascular: negative for edema, orthopnea, palpitations, paroxysmal nocturnal dyspnea, dyspnea on exertion  Dermatological: negative for rash Respiratory: negative for cough or wheezing Abdominal: negative for nausea, vomiting, diarrhea, bright red blood per rectum, melena, or hematemesis Neurologic: negative for visual changes, syncope, or dizziness MSK: occasional L leg discomfort on anterior shin, no known injury, erythema, swelling, change in pulse or temperature, no recent long travel or bedrest All other systems reviewed  and are otherwise negative except as noted above.  Labs:   Lab Results  Component Value Date   WBC 5.0 10/01/2013   HGB 12.6 10/01/2013   HCT 37.8 10/01/2013   MCV 92.6 10/01/2013   PLT 172 10/01/2013    Recent Labs Lab 10/01/13 0359  NA 139  K 3.6*  CL 101  CO2 26  BUN 16  CREATININE 1.17*  CALCIUM 9.1  GLUCOSE 83    Recent Labs  10/01/13 0730  TROPONINI <0.30   Lab Results  Component Value Date   CHOL 164 02/03/2013   HDL 88 02/03/2013   LDLCALC 66 02/03/2013   TRIG 51 02/03/2013   Lab Results  Component Value Date   DDIMER 0.58* 10/01/2013    Radiology/Studies:  Dg Chest 2 View 10/01/2013   CLINICAL DATA:  Chest pain  EXAM: CHEST  2 VIEW  COMPARISON:  None.  FINDINGS: Normal heart size and mediastinal contours. No acute infiltrate or edema. No effusion or pneumothorax. No acute osseous findings.  IMPRESSION: No active cardiopulmonary disease.   Electronically Signed   By: Tiburcio Pea M.D.   On: 10/01/2013 05:06   Ct Angio Chest Pe W/cm &/or Wo Cm 10/01/2013   CLINICAL DATA:  53 year old female who awoke with left side chest pain. Left upper extremity numbness and tingling. Shortness of Breath. Initial encounter.  EXAM: CT ANGIOGRAPHY CHEST WITH CONTRAST  TECHNIQUE: Multidetector CT imaging of the chest was performed using the standard protocol during bolus administration of intravenous contrast. Multiplanar CT image reconstructions and MIPs were obtained to evaluate the vascular anatomy.  CONTRAST:  65mL OMNIPAQUE IOHEXOL 350 MG/ML SOLN  COMPARISON:  Chest radiographs 10/01/2013.  FINDINGS: Good contrast bolus timing in the pulmonary arterial tree.  No focal filling defect identified in the pulmonary arterial tree to suggest the presence of acute pulmonary embolism.  Central airways are patent. Minor apical lung scarring more so on the right. Minimal lower lobe atelectasis. Otherwise negative lungs.  Negative thoracic inlet. No pericardial or pleural effusion. Negative visualized aorta.  No calcified coronary artery plaque identified. No mediastinal lymphadenopathy.  In the upper abdomen negative visible spleen, pancreas, bowel, adrenal glands, and renal upper poles. There are multiple low-density lesions in the visible liver. The largest have densitometry compatible with simple cysts (2.7 cm diameter central lesion measuring 9 Hounsfield units). The smaller lesions are too small to characterize.  No acute osseous abnormality identified.  Review of the MIP images confirms the above findings.  IMPRESSION: 1.  No evidence of acute pulmonary embolus. 2. No acute findings in the chest. 3. Multiple simple appearing cysts in the liver. Additional too small to characterize hypodense liver lesions.   Electronically Signed   By: Augusto Gamble M.D.   On: 10/01/2013 07:22   EKG: NSR 86bpm, T wave flattening in avL, otherwise no acute changes.  Physical Exam: Blood pressure 140/71, pulse 84, temperature 98.2 F (36.8 C), temperature source Oral, resp. rate 20, height 5\' 7"  (1.702 m), weight 123 lb (55.792 kg), SpO2 100.00%. General: Well developed, well nourished AAF in no acute distress. Head: Normocephalic, atraumatic, sclera non-icteric, no xanthomas, nares are without discharge.  Neck: Negative for carotid bruits. JVD not elevated. Lungs: Clear bilaterally to auscultation without wheezes, rales, or rhonchi. Breathing is unlabored. Heart: RRR with S1 S2. No murmurs, rubs, or gallops appreciated. Abdomen: Soft, non-tender, non-distended with normoactive bowel sounds. No hepatomegaly. No rebound/guarding. No obvious abdominal masses. Msk:  Strength and tone appear normal for age. Extremities: No clubbing or cyanosis. No edema.  Distal pedal pulses are 2+ and equal bilaterally. Neuro: Alert and oriented X 3. No focal deficit. No facial asymmetry. Moves all extremities spontaneously. Psych:  Responds to questions appropriately with a normal affect.    ASSESSMENT AND PLAN:   1. Chest pain/[place] -  symptoms concerning for unstable angina. EKG nonacute and troponins negative but story is worrisome. Discussed options for evaluation with Dr. Gala RomneyBensimhon. Will proceed with cardiac cath for further evaluation. Risks and benefits of cardiac catheterization have been discussed with the patient.  These include bleeding, infection, kidney damage, stroke, heart attack, death.  The patient understands these risks and is willing to proceed. 2. Mild hypokalemia - replete. 3. Refusal of blood products as patient is Jehovah's witness - noted. 4. Simple liver cysts by CT - d/w pt and instructed her to f/u PCP for monitoring.   Signed, Kathy Spiesayna Dunn PA-C 10/01/2013, 8:31 AM  Patient seen and examined with Kathy Spiesayna Dunn, PA-C. We discussed all aspects of the encounter. I agree with the assessment and plan as stated above.   CP very concerning for [place] especially in light of her family history. However no objective signs of ischemia. We discussed risks/indications of cath versus stress testing and we have decided to pursue cath. If cath negative would consider empiric trial of PPI.   Williard Keller,MD 8:46 AM

## 2013-10-01 NOTE — Progress Notes (Signed)
Arrived to room per bed, states mild chest pain off and on but MD aware, Rt radial level 0. 1st attempt to deflate resulted in profuse bleeding and small hematoma on both sides of TR band, 3 cc placed back in, manual pressure held.  Freq checks show no further bleeding or hematoma.  Family at bedside.  Dinner served.  VSS

## 2013-10-01 NOTE — ED Notes (Signed)
Patient returned from XR. 

## 2013-10-01 NOTE — ED Notes (Addendum)
Woke up with lt. Sided cp.  Dull. Non reproducible and non radiating. Some sob. Lt. Tingling, numb and cold.

## 2013-10-01 NOTE — Progress Notes (Addendum)
TR BAND REMOVAL  LOCATION:    right radial  DEFLATED PER PROTOCOL:    yes  TIME BAND OFF / DRESSING APPLIED:    2300  SITE UPON ARRIVAL:    Level 0  SITE AFTER BAND REMOVAL:    Level 0  REVERSE ALLEN'S TEST:     Positive per pleth rt thumb.  Radial pulse palpable.  CIRCULATION SENSATION AND MOVEMENT:    Within Normal Limits   yes  COMMENTS:   TR band removed, area cleansed w/ CHG wipes, no longer looks swollen or bruised, redressed w/ 2x2/tegaderm.  Post radial cath instructions given and reviewed w/ pt and family, questions answered.  Denies complaints except rt wrist tender on palpation.

## 2013-10-01 NOTE — ED Notes (Signed)
Called cath lab stated patient will be added to schedule however is not currently scheduled.

## 2013-10-01 NOTE — ED Notes (Signed)
CT called to inform of IV patent and ready.

## 2013-10-01 NOTE — ED Provider Notes (Addendum)
CSN: [id number]     Arrival date & time 10/01/13  0350 History   First MD Initiated Contact with Patient 10/01/13 0354     Chief Complaint  Patient presents with  . Chest Pain     (Consider location/radiation/quality/duration/timing/severity/associated sxs/prior Treatment) HPI Patient presents with left-sided chest pain that woke her from sleeping around 3 AM. She states she's in her normal state of health went to bed. She describes the pain as a "flushing"that did not radiate. It is associated with mild shortness of breath. She's had no recent cough, fever or chills. She has no history of coronary artery disease. She has no recent extended travel or hospitalization. She does have strong family history of coronary artery disease with her brother having an MI in his early 4550s. Her chest pain is unchanged at this time. She's been given an aspirin in triage. She denies any lower extremity swelling or pain. She has no associated nausea or vomiting. Past Medical History  Diagnosis Date  . Refusal of blood transfusions as patient is Jehovah's Witness     Jehovah witness  . Osteopenia 06/2010    T score -1.44 of the hip   Past Surgical History  Procedure Laterality Date  . Tubal ligation  1975  . Vaginal hysterectomy  11/2007    LAVH,BSO Leiomyomata/Menorrhagia   Family History  Problem Relation Age of Onset  . Hypertension Mother   . Hypertension Father   . Heart disease Father   . Heart disease Brother   . Cancer Brother   . Diabetes Paternal Uncle   . Cancer Maternal Aunt     prostate   History  Substance Use Topics  . Smoking status: Never Smoker   . Smokeless tobacco: Never Used  . Alcohol Use: No   OB History   Grav Para Term Preterm Abortions TAB SAB Ect Mult Living   2 2        2      Review of Systems  Constitutional: Negative for fever and chills.  Respiratory: Positive for shortness of breath. Negative for cough.   Cardiovascular: Positive for chest pain. Negative  for palpitations and leg swelling.  Gastrointestinal: Negative for nausea, vomiting, abdominal pain, diarrhea and constipation.  Genitourinary: Negative for dysuria, frequency and flank pain.  Musculoskeletal: Negative for back pain, neck pain and neck stiffness.  Skin: Negative for rash and wound.  Neurological: Negative for dizziness, weakness, light-headedness, numbness and headaches.      Allergies  Review of patient's allergies indicates no known allergies.  Home Medications   Current Outpatient Rx  Name  Route  Sig  Dispense  Refill  . estradiol (VIVELLE-DOT) 0.05 MG/24HR   Transdermal   Place 1 patch (0.05 mg total) onto the skin 2 (two) times a week.   8 patch   11   . Multiple Vitamins-Iron (MULTIVITAMIN/IRON PO)   Oral   Take by mouth.            BP 123/107  Pulse 84  Temp(Src) 98.2 F (36.8 C) (Oral)  Resp 20  Ht 5\' 7"  (1.702 m)  Wt 123 lb (55.792 kg)  BMI 19.26 kg/m2  SpO2 100% Physical Exam  Nursing note and vitals reviewed. Constitutional: She is oriented to person, place, and time. She appears well-developed and well-nourished. No distress.  HENT:  Head: Normocephalic and atraumatic.  Mouth/Throat: Oropharynx is clear and moist.  Eyes: EOM are normal. Pupils are equal, round, and reactive to light.  Neck: Normal range of  motion. Neck supple.  Cardiovascular: Normal rate and regular rhythm.   Pulmonary/Chest: Effort normal and breath sounds normal. No respiratory distress. She has no wheezes. She has no rales. She exhibits no tenderness.  Abdominal: Soft. Bowel sounds are normal. She exhibits no distension and no mass. There is no tenderness. There is no rebound and no guarding.  Musculoskeletal: Normal range of motion. She exhibits no edema and no tenderness.  No calf swelling or tenderness.  Neurological: She is alert and oriented to person, place, and time.  Moves all extremities without deficit. Sensation grossly intact.  Skin: Skin is warm and  dry. No rash noted. No erythema.  Psychiatric: She has a normal mood and affect. Her behavior is normal.    ED Course  Procedures (including critical care time) Labs Review Labs Reviewed  BASIC METABOLIC PANEL - Abnormal; Notable for the following:    Potassium 3.6 (*)    Creatinine, Ser 1.17 (*)    GFR calc non Af Amer 53 (*)    GFR calc Af Amer 61 (*)    All other components within normal limits  D-DIMER, QUANTITATIVE - Abnormal; Notable for the following:    D-Dimer, Quant 0.58 (*)    All other components within normal limits  CBC  TROPONIN I  I-STAT TROPOININ, ED   Imaging Review Dg Chest 2 View  10/01/2013   CLINICAL DATA:  Chest pain  EXAM: CHEST  2 VIEW  COMPARISON:  None.  FINDINGS: Normal heart size and mediastinal contours. No acute infiltrate or edema. No effusion or pneumothorax. No acute osseous findings.  IMPRESSION: No active cardiopulmonary disease.   Electronically Signed   By: Tiburcio Pea M.D.   On: 10/01/2013 05:06   Ct Angio Chest Pe W/cm &/or Wo Cm  10/01/2013   CLINICAL DATA:  53 year old female who awoke with left side chest pain. Left upper extremity numbness and tingling. Shortness of Breath. Initial encounter.  EXAM: CT ANGIOGRAPHY CHEST WITH CONTRAST  TECHNIQUE: Multidetector CT imaging of the chest was performed using the standard protocol during bolus administration of intravenous contrast. Multiplanar CT image reconstructions and MIPs were obtained to evaluate the vascular anatomy.  CONTRAST:  65mL OMNIPAQUE IOHEXOL 350 MG/ML SOLN  COMPARISON:  Chest radiographs 10/01/2013.  FINDINGS: Good contrast bolus timing in the pulmonary arterial tree.  No focal filling defect identified in the pulmonary arterial tree to suggest the presence of acute pulmonary embolism.  Central airways are patent. Minor apical lung scarring more so on the right. Minimal lower lobe atelectasis. Otherwise negative lungs.  Negative thoracic inlet. No pericardial or pleural effusion.  Negative visualized aorta. No calcified coronary artery plaque identified. No mediastinal lymphadenopathy.  In the upper abdomen negative visible spleen, pancreas, bowel, adrenal glands, and renal upper poles. There are multiple low-density lesions in the visible liver. The largest have densitometry compatible with simple cysts (2.7 cm diameter central lesion measuring 9 Hounsfield units). The smaller lesions are too small to characterize.  No acute osseous abnormality identified.  Review of the MIP images confirms the above findings.  IMPRESSION: 1.  No evidence of acute pulmonary embolus. 2. No acute findings in the chest. 3. Multiple simple appearing cysts in the liver. Additional too small to characterize hypodense liver lesions.   Electronically Signed   By: Augusto Gamble M.D.   On: 10/01/2013 07:22     EKG Interpretation   Date/Time:  Friday October 01 2013 03:55:33 EST Ventricular Rate:  86 PR Interval:  127 QRS  Duration: 97 QT Interval:  389 QTC Calculation: 465 R Axis:   13 Text Interpretation:  Sinus rhythm Baseline wander in lead(s) I III aVL V6  Confirmed by Tonnia Bardin  MD, Yemaya Barnier ([id number]) on 10/01/2013 4:18:22 AM      MDM   Final diagnoses:  None      Patient's chest pain has improved significantly though she still has mild, nagging discomfort. She has a normal CT imaging of her chest. I will discuss with cardiology regarding disposition.  Discuss with cardiology. Will see the patient in emergency department. Patient refused morphine. Will be signed out to oncoming emergency physician pending disposition by cardiology.  Loren Racer, MD 10/01/13 6045  Loren Racer, MD 10/10/13 [phone]

## 2013-10-01 NOTE — Interval H&P Note (Signed)
History and Physical Interval Note:  10/01/2013 6:54 PM  Kathy GreenspanPaula V Kirsh  has presented today for surgery, with the diagnosis of c  The various methods of treatment have been discussed with the patient and family. After consideration of risks, benefits and other options for treatment, the patient has consented to  Procedure(s): LEFT HEART CATHETERIZATION WITH CORONARY ANGIOGRAM (N/A) as a surgical intervention .  The patient's history has been reviewed, patient examined, no change in status, stable for surgery.  I have reviewed the patient's chart and labs.  Questions were answered to the patient's satisfaction.   Cath Lab Visit (complete for each Cath Lab visit)  Clinical Evaluation Leading to the Procedure:   ACS: yes  Non-ACS:    Anginal Classification: CCS III  Anti-ischemic medical therapy: No Therapy  Non-Invasive Test Results: No non-invasive testing performed  Prior CABG: No previous CABG        Kyriaki Moder [place] MD,FACC 10/01/2013 6:54 PM

## 2013-10-01 NOTE — Progress Notes (Addendum)
ANTICOAGULATION CONSULT NOTE - Initial Consult  Pharmacy Consult:  Heparin Indication: chest pain/ACS  No Known Allergies  Patient Measurements: Height: 5\' 7"  (170.2 cm) Weight: 123 lb (55.792 kg) IBW/kg (Calculated) : 61.6 Heparin Dosing Weight: 56 kg  Vital Signs: Temp: 98.2 F (36.8 C) (03/06 0746) Temp src: Oral (03/06 0358) BP: 115/76 mmHg (03/06 1030) Pulse Rate: 78 (03/06 1030)  Labs:  Recent Labs  10/01/13 0359 10/01/13 0730 10/01/13 0910  HGB 12.6  --   --   HCT 37.8  --   --   PLT 172  --   --   LABPROT  --   --  12.6  INR  --   --  0.96  CREATININE 1.17*  --   --   TROPONINI  --  <0.30 <0.30    Estimated Creatinine Clearance: 49.5 ml/min (by C-G formula based on Cr of 1.17).   Medical History: Past Medical History  Diagnosis Date  . Refusal of blood transfusions as patient is Jehovah's Witness     Jehovah witness  . Osteopenia 06/2010    T score -1.44 of the hip       Assessment: [age] YOF with no significant PMH admitted with ACS to start IV heparin.  Baseline labs reviewed.   Goal of Therapy:  Heparin level 0.3-0.7 units/ml Monitor platelets by anticoagulation protocol: Yes    Plan:  - Heparin 2500 units IV bolus, then gtt at 650 units/hr - Check 6 hr HL - Daily HL / CBC  Thuy D. Laney Potashang, PharmD, BCPS Pager:  [phone] 10/01/2013, 11:24 AM  Addendum:  Heparin level = 0.35 at goal. Patient is going to cath lab tonight.  Plan: - No change for IV heparin for now - f/u after cath.

## 2013-10-01 NOTE — ED Notes (Signed)
Spoke with pharmacist stated to start heparin and heparin level will be drawn at 6:30pm STAT,

## 2013-10-01 NOTE — CV Procedure (Signed)
    Cardiac Catheterization Procedure Note  Name: Kathy Douglas V Salminen MRN: [id number] DOB: 05/17/1961  Procedure: Left Heart Cath, Selective Coronary Angiography, LV angiography  Indication: 53 yo female presents with symptoms of refractory chest pain.   Procedural Details: The right wrist was prepped, draped, and anesthetized with 1% lidocaine. Using the modified Seldinger technique, a 5 French sheath was introduced into the right radial artery. 3 mg of verapamil was administered through the sheath, weight-based unfractionated heparin was administered intravenously. Standard Judkins catheters were used for selective coronary angiography and left ventriculography. Catheter exchanges were performed over an exchange length guidewire. There were no immediate procedural complications. A TR band was used for radial hemostasis at the completion of the procedure.  The patient was transferred to the post catheterization recovery area for further monitoring.  Procedural Findings: Hemodynamics: AO 113/70 mm Hg LV 112/3 mm Hg  Coronary angiography: Coronary dominance: right  Left mainstem: Normal.  Left anterior descending (LAD): Normal.  Left circumflex (LCx): Normal.  Right coronary artery (RCA): Normal.  Left ventriculography: Left ventricular systolic function is normal, LVEF is estimated at 55-65%, there is no significant mitral regurgitation   Final Conclusions:   1. Normal coronary anatomy 2. Normal LV function.  Recommendations: medical therapy for noncardiac chest pain.  Meily Glowacki [facility] HospitalJordanMD,FACC 10/01/2013, 7:22 PM

## 2013-10-01 NOTE — ED Notes (Signed)
Pt. Offered morphine for cp, rates pain 4 out of 10. Pt refused, stated "I don't think the pain that's bad." MD notified.

## 2013-10-01 NOTE — ED Notes (Signed)
Called Cath Lab stated to transport patient to floor.

## 2013-10-02 ENCOUNTER — Encounter (HOSPITAL_COMMUNITY): Payer: Self-pay | Admitting: Nurse Practitioner

## 2013-10-02 DIAGNOSIS — R072 Precordial pain: Secondary | ICD-10-CM

## 2013-10-02 DIAGNOSIS — R0789 Other chest pain: Secondary | ICD-10-CM

## 2013-10-02 LAB — BASIC METABOLIC PANEL
BUN: 14 mg/dL (ref 6–23)
CALCIUM: 8.7 mg/dL (ref 8.4–10.5)
CO2: 23 mEq/L (ref 19–32)
Chloride: 106 mEq/L (ref 96–112)
Creatinine, Ser: 1.11 mg/dL — ABNORMAL HIGH (ref 0.50–1.10)
GFR calc Af Amer: 65 mL/min — ABNORMAL LOW (ref >90)
GFR, EST NON AFRICAN AMERICAN: 56 mL/min — AB (ref >90)
GLUCOSE: 80 mg/dL (ref 70–99)
Potassium: 3.9 mEq/L (ref 3.7–5.3)
SODIUM: 140 meq/L (ref 137–147)

## 2013-10-02 LAB — CBC
HCT: 32.6 % — ABNORMAL LOW (ref 36.0–46.0)
HEMOGLOBIN: 11 g/dL — AB (ref 12.0–15.0)
MCH: 31.5 pg (ref 26.0–34.0)
MCHC: 33.7 g/dL (ref 30.0–36.0)
MCV: 93.4 fL (ref 78.0–100.0)
PLATELETS: 141 10*3/uL — AB (ref 150–400)
RBC: 3.49 MIL/uL — AB (ref 3.87–5.11)
RDW: 13.1 % (ref 11.5–15.5)
WBC: 4.3 10*3/uL (ref 4.0–10.5)

## 2013-10-02 LAB — TSH: TSH: 0.783 u[IU]/mL (ref 0.350–4.500)

## 2013-10-02 MED ORDER — PANTOPRAZOLE SODIUM 40 MG PO TBEC: 40.0000 mg | DELAYED_RELEASE_TABLET | Freq: Every day | ORAL | Status: DC

## 2013-10-02 NOTE — Discharge Summary (Signed)
Discharge Summary   Patient ID: Kathy Douglas,  MRN: [id number], DOB/AGE: Jul 18, 1961 53 y.o.  Admit date: 10/01/2013 Discharge date: 10/02/2013  Primary Care Provider: Leanor Rubenstein Primary Cardiologist: Katherina Right, MD   Discharge Diagnoses Principal Problem:   Midsternal chest pain  Allergies No Known Allergies  Procedures  CT Angio of the Chest with Contrast  3.5.2015  IMPRESSION: 1.  No evidence of acute pulmonary embolus. 2. No acute findings in the chest. 3. Multiple simple appearing cysts in the liver. Additional too small to characterize hypodense liver lesions. _____________   Cardiac Catheterization 3.6.2015  Procedural Findings: Hemodynamics: AO 113/70 mm Hg LV 112/3 mm Hg  Coronary angiography: Coronary dominance: right  Left mainstem: Normal. Left anterior descending (LAD): Normal. Left circumflex (LCx): Normal. Right coronary artery (RCA): Normal. Left ventriculography: Left ventricular systolic function is normal, LVEF is estimated at 55-65%, there is no significant mitral regurgitation   Final Conclusions:   1. Normal coronary anatomy 2. Normal LV function.  Recommendations: medical therapy for noncardiac chest pain. _____________   History of Present Illness  53 y/o female with no prior cardiac history.  She was in her usual state of health until the morning of admission when she awoke suddenly with a dull and flushing sensation across her chest and into her left arm. This was associated with mild nausea. Symptoms did not improve with position changes. Her husband drove her to the [facility] where she received 3 sublingual nitroglycerin tablets with some reduction in chest pain. Troponin was normal. ECG was nonacute. D-dimer was elevated at 0.58 and CT angiography of the chest was performed and was negative for pulmonary embolism. Multiple simple appearing hepatic cysts were noted incidentally. She was seen by cardiology and decision was made to admit her  for further evaluation.  Hospital Course  Patient ruled out for myocardial infarction. Decision was made to pursue diagnostic cardiac catheterization and this was performed on March 6, revealing normal coronary arteries and normal LV function. Post catheterization, she has been in doing without recurrent symptoms or limitations and she will be discharged home today in good condition. We are adding PPI therapy to her regimen and have recommended that she followup with primary care.  Discharge Vitals Blood pressure 105/72, pulse 89, temperature 98.4 F (36.9 C), temperature source Oral, resp. rate 16, height 5\' 7"  (1.702 m), weight 123 lb (55.792 kg), SpO2 98.00%.  Filed Weights   10/01/13 0356  Weight: 123 lb (55.792 kg)   Labs  CBC  Recent Labs  10/01/13 0359 10/02/13 0358  WBC 5.0 4.3  HGB 12.6 11.0*  HCT 37.8 32.6*  MCV 92.6 93.4  PLT 172 141*   Basic Metabolic Panel  Recent Labs  10/01/13 0359 10/02/13 0358  NA 139 140  K 3.6* 3.9  CL 101 106  CO2 26 23  GLUCOSE 83 80  BUN 16 14  CREATININE 1.17* 1.11*  CALCIUM 9.1 8.7   Liver Function Tests  Recent Labs  10/01/13 0910  AST 17  ALT 10  ALKPHOS 41  BILITOT 0.3  PROT 6.4  ALBUMIN 3.4*   Cardiac Enzymes  Recent Labs  10/01/13 0910 10/01/13 1445 10/01/13 1936  TROPONINI <0.30 <0.30 <0.30   D-Dimer  Recent Labs  10/01/13 0429  DDIMER 0.58*   Fasting Lipid Panel  Recent Labs  10/01/13 0910  CHOL 144  HDL 89  LDLCALC 50  TRIG 25  CHOLHDL 1.6   Thyroid Function Tests  Recent Labs  10/01/13 1445  TSH 0.783   Disposition  Pt is being discharged home today in good condition.  Follow-up Plans & Appointments      Follow-up Information   Follow up with Leanor RubensteinSUN,VYVYAN Y, MD. (1-2 wks.)    Specialty:  Family Medicine   Contact information:   [address], [address] [place] [address] [phone]      Discharge Medications    Medication List         estradiol 0.05  MG/24HR patch  Commonly known as:  VIVELLE-DOT  Place 1 patch (0.05 mg total) onto the skin 2 (two) times a week.     MULTIVITAMIN/IRON PO  Take by mouth.     pantoprazole 40 MG tablet  Commonly known as:  PROTONIX  Take 1 tablet (40 mg total) by mouth daily.       Outstanding Labs/Studies  None  Duration of Discharge Encounter   Greater than 30 minutes including physician time.  Signed, Nicolasa Duckinghristopher Berge NP 10/02/2013, 9:58 AM   Attending Note:   The patient was seen and examined.  Agree with assessment and plan as noted above.  Changes made to the above note as needed.  See note from earlier today   Alvia GrovePhilip J. Macallister Ashmead, Jr., MD, [facility] 10/02/2013, 5:54 PM

## 2013-10-02 NOTE — Progress Notes (Signed)
Patient Name: Kathy Douglas Date of Encounter: 10/02/2013     Principal Problem:   Midsternal chest pain   SUBJECTIVE  S/p cath yesterday revealing normal cors.  No chest pain or sob this morning.  Very mild/brief c/p last night. R wrist feels good.  Eager to go home.  CURRENT MEDS . aspirin EC  81 mg Oral Daily  . multivitamin with minerals  1 tablet Oral Daily    OBJECTIVE  Filed Vitals:   10/01/13 2145 10/02/13 0100 10/02/13 0420 10/02/13 0727  BP: 118/77 117/58 99/69 105/72  Pulse: 87 89 92 89  Temp:  98.7 F (37.1 C) 98.7 F (37.1 C) 98.4 F (36.9 C)  TempSrc:  Oral Oral Oral  Resp:  16 16 16   Height:      Weight:      SpO2: 100% 98% 98% 98%    Intake/Output Summary (Last 24 hours) at 10/02/13 0816 Last data filed at 10/02/13 0100  Gross per 24 hour  Intake 568.81 ml  Output    900 ml  Net -331.19 ml   Filed Weights   10/01/13 0356  Weight: 123 lb (55.792 kg)    PHYSICAL EXAM  General: Pleasant, NAD. Neuro: Alert and oriented X 3. Moves all extremities spontaneously. Psych: Normal affect. HEENT:  Normal  Neck: Supple without bruits or JVD. Lungs:  Resp regular and unlabored, CTA. Heart: RRR no s3, s4, or murmurs. Abdomen: Soft, non-tender, non-distended, BS + x 4.  Extremities: No clubbing, cyanosis or edema. DP/PT/Radials 2+ and equal bilaterally.  R wrist cath site w/o bleeding/bruit/hematoma.  Accessory Clinical Findings  CBC  Recent Labs  10/01/13 0359 10/02/13 0358  WBC 5.0 4.3  HGB 12.6 11.0*  HCT 37.8 32.6*  MCV 92.6 93.4  PLT 172 141*   Basic Metabolic Panel  Recent Labs  10/01/13 0359 10/02/13 0358  NA 139 140  K 3.6* 3.9  CL 101 106  CO2 26 23  GLUCOSE 83 80  BUN 16 14  CREATININE 1.17* 1.11*  CALCIUM 9.1 8.7   Liver Function Tests  Recent Labs  10/01/13 0910  AST 17  ALT 10  ALKPHOS 41  BILITOT 0.3  PROT 6.4  ALBUMIN 3.4*   Cardiac Enzymes  Recent Labs  10/01/13 0910 10/01/13 1445 10/01/13 1936   TROPONINI <0.30 <0.30 <0.30   D-Dimer  Recent Labs  10/01/13 0429  DDIMER 0.58*   Fasting Lipid Panel  Recent Labs  10/01/13 0910  CHOL 144  HDL 89  LDLCALC 50  TRIG 25  CHOLHDL 1.6   Thyroid Function Tests  Recent Labs  10/01/13 1445  TSH 0.783    TELE  Rsr, pvc's.  Radiology/Studies  Dg Chest 2 View  10/01/2013   CLINICAL DATA:  Chest pain  EXAM: CHEST  2 VIEW  COMPARISON:  None.  FINDINGS: Normal heart size and mediastinal contours. No acute infiltrate or edema. No effusion or pneumothorax. No acute osseous findings.  IMPRESSION: No active cardiopulmonary disease.   Electronically Signed   By: Tiburcio PeaJonathan  Watts M.D.   On: 10/01/2013 05:06   Ct Angio Chest Pe W/cm &/or Wo Cm  10/01/2013   CLINICAL DATA:  53 year old female who awoke with left side chest pain. Left upper extremity numbness and tingling. Shortness of Breath. Initial encounter.  EXAM: CT ANGIOGRAPHY CHEST WITH CONTRAST  TECHNIQUE: Multidetector CT imaging of the chest was performed using the standard protocol during bolus administration of intravenous contrast. Multiplanar CT image reconstructions and MIPs were  obtained to evaluate the vascular anatomy.  CONTRAST:  65mL OMNIPAQUE IOHEXOL 350 MG/ML SOLN  COMPARISON:  Chest radiographs 10/01/2013.  FINDINGS: Good contrast bolus timing in the pulmonary arterial tree.  No focal filling defect identified in the pulmonary arterial tree to suggest the presence of acute pulmonary embolism.  Central airways are patent. Minor apical lung scarring more so on the right. Minimal lower lobe atelectasis. Otherwise negative lungs.  Negative thoracic inlet. No pericardial or pleural effusion. Negative visualized aorta. No calcified coronary artery plaque identified. No mediastinal lymphadenopathy.  In the upper abdomen negative visible spleen, pancreas, bowel, adrenal glands, and renal upper poles. There are multiple low-density lesions in the visible liver. The largest have  densitometry compatible with simple cysts (2.7 cm diameter central lesion measuring 9 Hounsfield units). The smaller lesions are too small to characterize.  No acute osseous abnormality identified.  Review of the MIP images confirms the above findings.  IMPRESSION: 1.  No evidence of acute pulmonary embolus. 2. No acute findings in the chest. 3. Multiple simple appearing cysts in the liver. Additional too small to characterize hypodense liver lesions.   Electronically Signed   By: Augusto Gamble M.D.   On: 10/01/2013 07:22    ASSESSMENT AND PLAN  1.  Midsternal Chest Pain:  S/p cath revealing nl cors.  Troponin's nl.  Add PPI.  Ambulate and plan d/c today.  F/u with PCP in 1-2 wks.  Signed, Nicolasa Ducking NP   Attending Note:   The patient was seen and examined.  Agree with assessment and plan as noted above.  Changes made to the above note as needed.  Pt is doing well.  Cath shows normal coronaries. Right radial cath site looks Ok She may go home today.  I will see her if needed but she can probably follow up with her primary medical doctor.   Vesta Mixer, Montez Hageman., MD, [facility] 10/02/2013, 9:16 AM

## 2013-10-02 NOTE — Discharge Instructions (Signed)

## 2013-10-02 NOTE — Progress Notes (Signed)
Utilization Review completed.  

## 2013-11-01 ENCOUNTER — Other Ambulatory Visit: Payer: Self-pay

## 2013-11-01 DIAGNOSIS — Z1231 Encounter for screening mammogram for malignant neoplasm of breast: Secondary | ICD-10-CM

## 2013-11-08 ENCOUNTER — Ambulatory Visit
Admission: RE | Admit: 2013-11-08 | Discharge: 2013-11-08 | Disposition: A | Discharge status: Home / Self Care | Payer: BC Managed Care – PPO | Source: Ambulatory Visit

## 2013-11-08 DIAGNOSIS — Z1231 Encounter for screening mammogram for malignant neoplasm of breast: Secondary | ICD-10-CM

## 2013-12-17 ENCOUNTER — Ambulatory Visit (HOSPITAL_COMMUNITY): Payer: BC Managed Care – PPO | Attending: Family Medicine | Admitting: Cardiology

## 2013-12-17 ENCOUNTER — Other Ambulatory Visit (HOSPITAL_COMMUNITY): Payer: Self-pay | Admitting: Family Medicine

## 2013-12-17 DIAGNOSIS — R011 Cardiac murmur, unspecified: Secondary | ICD-10-CM

## 2013-12-17 DIAGNOSIS — R0789 Other chest pain: Secondary | ICD-10-CM

## 2013-12-17 NOTE — Progress Notes (Signed)
Echo performed. 

## 2013-12-31 ENCOUNTER — Telehealth: Payer: Self-pay | Admitting: Cardiovascular Disease

## 2013-12-31 NOTE — Telephone Encounter (Signed)
Closed encounter °

## 2014-01-06 ENCOUNTER — Ambulatory Visit: Payer: BC Managed Care – PPO | Admitting: Cardiovascular Disease

## 2014-02-04 ENCOUNTER — Other Ambulatory Visit (HOSPITAL_COMMUNITY)
Admission: RE | Admit: 2014-02-04 | Discharge: 2014-02-04 | Disposition: A | Discharge status: Home / Self Care | Payer: BC Managed Care – PPO | Source: Ambulatory Visit | Attending: Gynecology | Admitting: Gynecology

## 2014-02-04 ENCOUNTER — Encounter: Payer: Self-pay | Admitting: Gynecology

## 2014-02-04 ENCOUNTER — Ambulatory Visit (INDEPENDENT_AMBULATORY_CARE_PROVIDER_SITE_OTHER): Payer: BC Managed Care – PPO | Admitting: Gynecology

## 2014-02-04 VITALS — BP 110/66 | Ht 68.0 in | Wt 122.0 lb

## 2014-02-04 DIAGNOSIS — Z01419 Encounter for gynecological examination (general) (routine) without abnormal findings: Secondary | ICD-10-CM

## 2014-02-04 DIAGNOSIS — Z79890 Hormone replacement therapy (postmenopausal): Secondary | ICD-10-CM

## 2014-02-04 DIAGNOSIS — N952 Postmenopausal atrophic vaginitis: Secondary | ICD-10-CM

## 2014-02-04 MED ORDER — ESTRADIOL 0.05 MG/24HR TD PTTW: 1.0000 | MEDICATED_PATCH | TRANSDERMAL | Status: DC

## 2014-02-04 NOTE — Addendum Note (Signed)
Addended by: Dayna BarkerGARDNER, Yee Gangi K on: 02/04/2014 09:27 AM   Modules accepted: Orders

## 2014-02-04 NOTE — Progress Notes (Signed)
Abner Greenspanaula V Baranowski 09/07/1960 [id number]        53 y.o.  G2P2 for annual exam.  Doing well.  Past medical history,surgical history, problem list, medications, allergies, family history and social history were all reviewed and documented as reviewed in the EPIC chart.  ROS:  12 system ROS performed with pertinent positives and negatives included in the history, assessment and plan.   Additional significant findings :  None   Exam: Kim assistant Filed Vitals:   02/04/14 0853  BP: 110/66  Height: 5\' 8"  (1.727 m)  Weight: 122 lb (55.339 kg)   General appearance:  Normal affect, orientation and appearance. Skin: Grossly normal HEENT: Without gross lesions.  No cervical or supraclavicular adenopathy. Thyroid normal.  Lungs:  Clear without wheezing, rales or rhonchi Cardiac: RR, without RMG Abdominal:  Soft, nontender, without masses, guarding, rebound, organomegaly or hernia Breasts:  Examined lying and sitting without masses, retractions, discharge or axillary adenopathy. Pelvic:  Ext/BUS/vagina with generalized atrophic changes  Adnexa  Without masses or tenderness    Anus and perineum  Normal   Rectovaginal  Normal sphincter tone without palpated masses or tenderness.    Assessment/Plan:  53 y.o. G2P2 female for annual exam.   1. 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 On Vivelle 0.05 mg patches. Wants to continue.  I again reviewed the whole issue of HRT with her to include the WHI study with increased risk of stroke, heart attack, DVT and breast cancer. The ACOG and NAMS statements for lowest dose for the shortest period of time reviewed. Transdermal versus oral first-pass effect benefit discussed. Patient understands the issues, accepts the risks and I refilled her x1 year. 2. Pap smear 2012. Pap of the vaginal cuff done today. No history of abnormal Pap smears previously. Reviewed current screening guidelines and options to stop screening as she is status  post hysterectomy for benign indications versus less frequent screening intervals reviewed. Will readdress on annual basis. 3. Mammography 10/2013. Continue with annual mammography. SBE monthly review. 4. Colonoscopy 2005. Patient due now and knows to call to arrange. 5. DEXA 2011 T score -1.4 of the hip. Followup DEXA in 2014 normal. Plan repeat at 5 year interval. Increased calcium vitamin D. 6. Health maintenance. No blood work done as she reports this done elsewhere. Followup in one year, sooner as needed.   Note: This document was prepared with digital dictation and possible smart phrase technology. Any transcriptional errors that result from this process are unintentional.   Dara LordsFONTAINE,Eastin Swing P MD, 9:13 AM 02/04/2014

## 2014-02-04 NOTE — Patient Instructions (Signed)
Follow up in one year, sooner if any issues.  You may obtain a copy of any labs that were done today by logging onto MyChart as outlined in the instructions provided with your AVS (after visit summary). The office will not call with normal lab results but certainly if there are any significant abnormalities then we will contact you.   Health Maintenance, Female A healthy lifestyle and preventative care can promote health and wellness.  Maintain regular health, dental, and eye exams.  Eat a healthy diet. Foods like vegetables, fruits, whole grains, low-fat dairy products, and lean protein foods contain the nutrients you need without too many calories. Decrease your intake of foods high in solid fats, added sugars, and salt. Get information about a proper diet from your caregiver, if necessary.  Regular physical exercise is one of the most important things you can do for your health. Most adults should get at least 150 minutes of moderate-intensity exercise (any activity that increases your heart rate and causes you to sweat) each week. In addition, most adults need muscle-strengthening exercises on 2 or more days a week.   Maintain a healthy weight. The body mass index (BMI) is a screening tool to identify possible weight problems. It provides an estimate of body fat based on height and weight. Your caregiver can help determine your BMI, and can help you achieve or maintain a healthy weight. For adults 20 years and older:  A BMI below 18.5 is considered underweight.  A BMI of 18.5 to 24.9 is normal.  A BMI of 25 to 29.9 is considered overweight.  A BMI of 30 and above is considered obese.  Maintain normal blood lipids and cholesterol by exercising and minimizing your intake of saturated fat. Eat a balanced diet with plenty of fruits and vegetables. Blood tests for lipids and cholesterol should begin at age 20 and be repeated every 5 years. If your lipid or cholesterol levels are high, you are  over 50, or you are a high risk for heart disease, you may need your cholesterol levels checked more frequently.Ongoing high lipid and cholesterol levels should be treated with medicines if diet and exercise are not effective.  If you smoke, find out from your caregiver how to quit. If you do not use tobacco, do not start.  Lung cancer screening is recommended for adults aged 55 80 years who are at high risk for developing lung cancer because of a history of smoking. Yearly low-dose computed tomography (CT) is recommended for people who have at least a 30-pack-year history of smoking and are a current smoker or have quit within the past 15 years. A pack year of smoking is smoking an average of 1 pack of cigarettes a day for 1 year (for example: 1 pack a day for 30 years or 2 packs a day for 15 years). Yearly screening should continue until the smoker has stopped smoking for at least 15 years. Yearly screening should also be stopped for people who develop a health problem that would prevent them from having lung cancer treatment.  If you are pregnant, do not drink alcohol. If you are breastfeeding, be very cautious about drinking alcohol. If you are not pregnant and choose to drink alcohol, do not exceed 1 drink per day. One drink is considered to be 12 ounces (355 mL) of beer, 5 ounces (148 mL) of wine, or 1.5 ounces (44 mL) of liquor.  Avoid use of street drugs. Do not share needles with anyone. Ask   help if you need support or instructions about stopping the use of drugs.  High blood pressure causes heart disease and increases the risk of stroke. Blood pressure should be checked at least every 1 to 2 years. Ongoing high blood pressure should be treated with medicines, if weight loss and exercise are not effective.  If you are 55 to 53 years old, ask your caregiver if you should take aspirin to prevent strokes.  Diabetes screening involves taking a blood sample to check your fasting blood sugar  level. This should be done once every 3 years, after age 45, if you are within normal weight and without risk factors for diabetes. Testing should be considered at a younger age or be carried out more frequently if you are overweight and have at least 1 risk factor for diabetes.  Breast cancer screening is essential preventative care for women. You should practice "breast self-awareness." This means understanding the normal appearance and feel of your breasts and may include breast self-examination. Any changes detected, no matter how small, should be reported to a caregiver. Women in their 20s and 30s should have a clinical breast exam (CBE) by a caregiver as part of a regular health exam every 1 to 3 years. After age 40, women should have a CBE every year. Starting at age 40, women should consider having a mammogram (breast X-ray) every year. Women who have a family history of breast cancer should talk to their caregiver about genetic screening. Women at a high risk of breast cancer should talk to their caregiver about having an MRI and a mammogram every year.  Breast cancer gene (BRCA)-related cancer risk assessment is recommended for women who have family members with BRCA-related cancers. BRCA-related cancers include breast, ovarian, tubal, and peritoneal cancers. Having family members with these cancers may be associated with an increased risk for harmful changes (mutations) in the breast cancer genes BRCA1 and BRCA2. Results of the assessment will determine the need for genetic counseling and BRCA1 and BRCA2 testing.  The Pap test is a screening test for cervical cancer. Women should have a Pap test starting at age 21. Between ages 21 and 29, Pap tests should be repeated every 2 years. Beginning at age 30, you should have a Pap test every 3 years as long as the past 3 Pap tests have been normal. If you had a hysterectomy for a problem that was not cancer or a condition that could lead to cancer, then  you no longer need Pap tests. If you are between ages 65 and 70, and you have had normal Pap tests going back 10 years, you no longer need Pap tests. If you have had past treatment for cervical cancer or a condition that could lead to cancer, you need Pap tests and screening for cancer for at least 20 years after your treatment. If Pap tests have been discontinued, risk factors (such as a new sexual partner) need to be reassessed to determine if screening should be resumed. Some women have medical problems that increase the chance of getting cervical cancer. In these cases, your caregiver may recommend more frequent screening and Pap tests.  The human papillomavirus (HPV) test is an additional test that may be used for cervical cancer screening. The HPV test looks for the virus that can cause the cell changes on the cervix. The cells collected during the Pap test can be tested for HPV. The HPV test could be used to screen women aged 30 years and older,   and should be used in women of any age who have unclear Pap test results. After the age of 30, women should have HPV testing at the same frequency as a Pap test.  Colorectal cancer can be detected and often prevented. Most routine colorectal cancer screening begins at the age of 50 and continues through age 75. However, your caregiver may recommend screening at an earlier age if you have risk factors for colon cancer. On a yearly basis, your caregiver may provide home test kits to check for hidden blood in the stool. Use of a small camera at the end of a tube, to directly examine the colon (sigmoidoscopy or colonoscopy), can detect the earliest forms of colorectal cancer. Talk to your caregiver about this at age 50, when routine screening begins. Direct examination of the colon should be repeated every 5 to 10 years through age 75, unless early forms of pre-cancerous polyps or small growths are found.  Hepatitis C blood testing is recommended for all people born  from 1945 through 1965 and any individual with known risks for hepatitis C.  Practice safe sex. Use condoms and avoid high-risk sexual practices to reduce the spread of sexually transmitted infections (STIs). Sexually active women aged 25 and younger should be checked for Chlamydia, which is a common sexually transmitted infection. Older women with new or multiple partners should also be tested for Chlamydia. Testing for other STIs is recommended if you are sexually active and at increased risk.  Osteoporosis is a disease in which the bones lose minerals and strength with aging. This can result in serious bone fractures. The risk of osteoporosis can be identified using a bone density scan. Women ages 65 and over and women at risk for fractures or osteoporosis should discuss screening with their caregivers. Ask your caregiver whether you should be taking a calcium supplement or vitamin D to reduce the rate of osteoporosis.  Menopause can be associated with physical symptoms and risks. Hormone replacement therapy is available to decrease symptoms and risks. You should talk to your caregiver about whether hormone replacement therapy is right for you.  Use sunscreen. Apply sunscreen liberally and repeatedly throughout the day. You should seek shade when your shadow is shorter than you. Protect yourself by wearing long sleeves, pants, a wide-brimmed hat, and sunglasses year round, whenever you are outdoors.  Notify your caregiver of new moles or changes in moles, especially if there is a change in shape or color. Also notify your caregiver if a mole is larger than the size of a pencil eraser.  Stay current with your immunizations. Document Released: 01/28/2011 Document Revised: 11/09/2012 Document Reviewed: 01/28/2011 [employer] Patient Information 2014 [employer].   

## 2014-02-05 LAB — URINALYSIS W MICROSCOPIC + REFLEX CULTURE
Bacteria, UA: NONE SEEN
Bilirubin Urine: NEGATIVE
Casts: NONE SEEN
Crystals: NONE SEEN
GLUCOSE, UA: NEGATIVE mg/dL
Hgb urine dipstick: NEGATIVE
Ketones, ur: NEGATIVE mg/dL
LEUKOCYTES UA: NEGATIVE
Nitrite: NEGATIVE
PROTEIN: NEGATIVE mg/dL
SQUAMOUS EPITHELIAL / LPF: NONE SEEN
Specific Gravity, Urine: <1.005 — ABNORMAL LOW (ref 1.005–1.030)
Urobilinogen, UA: 0.2 mg/dL (ref 0.0–1.0)
pH: 5.5 (ref 5.0–8.0)

## 2014-02-07 LAB — CYTOLOGY - PAP

## 2014-02-15 ENCOUNTER — Ambulatory Visit (INDEPENDENT_AMBULATORY_CARE_PROVIDER_SITE_OTHER): Payer: BC Managed Care – PPO | Admitting: Cardiovascular Disease

## 2014-02-15 ENCOUNTER — Encounter: Payer: Self-pay | Admitting: Cardiovascular Disease

## 2014-02-15 VITALS — BP 136/92 | HR 74 | Ht 67.0 in | Wt 125.6 lb

## 2014-02-15 DIAGNOSIS — I341 Nonrheumatic mitral (valve) prolapse: Secondary | ICD-10-CM

## 2014-02-15 DIAGNOSIS — I059 Rheumatic mitral valve disease, unspecified: Secondary | ICD-10-CM

## 2014-02-15 DIAGNOSIS — I200 Unstable angina: Secondary | ICD-10-CM

## 2014-02-15 NOTE — Patient Instructions (Signed)
  We will see you back in follow up in 1 year with Dr Allyson SabalBerry.   Dr Allyson SabalBerry has ordered:  Echocardiogram (to be done in November). Echocardiography is a painless test that uses sound waves to create images of your heart. It provides your doctor with information about the size and shape of your heart and how well your heart's chambers and valves are working. This procedure takes approximately one hour. There are no restrictions for this procedure.

## 2014-02-15 NOTE — Assessment & Plan Note (Signed)
A 2-D echocardiogram performed recently (12/15/13) revealed moderate mitral valve prolapse with mild to moderate regurgitation and ejection fraction of 40-45%. This is clearly different than her EF at the time of cardiac catheterization 2 months prior. We did talk about SBE prophylaxis during dental procedures. I'm going to repeat a 2-D echo in 6 months and then annually thereafter. Currently, she has no symptoms of congestive heart failure and her only size is normal.

## 2014-02-15 NOTE — Progress Notes (Signed)
02/15/2014 Abner Greenspan   [birth year]  [id number]  Primary Physician Allean Found, MD Primary Cardiologist: Runell Gess MD Roseanne Reno   HPI:  Ms. Bohanon is a delightful 53 year old thin and thick appearing married African American female mother of 2 children works in Audiological scientist at [employer]. 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 Merri Brunette for evaluation of murmur and mitral valve prolapse demonstrated on recent 2-D echo performed 2 months ago. She has no cardiac risk factors other than family history. Father died of myocardial infarction at age 34 and her brother had an MI at age 59. She's never had a heart attack or stroke. She denies chest pain but does have mild dyspnea. She was hospitalized from 10/01/13 overnight for chest pain and underwent cardiac catheterization by Dr. Swaziland revealing normal coronaries and normal left ventricular function. A recent 2-D echocardiogram performed 12/17/13 revealed moderate mitral valve prolapse, mild to moderate MR with moderate LV dysfunction. She is relatively asymptomatic.   Current Outpatient Prescriptions  Medication Sig Dispense Refill  . estradiol (VIVELLE-DOT) 0.05 MG/24HR patch Place 1 patch (0.05 mg total) onto the skin 2 (two) times a week.  8 patch  11  . Multiple Vitamins-Iron (MULTIVITAMIN/IRON PO) Take by mouth.         No current facility-administered medications for this visit.    No Known Allergies  History   Social History  . Marital Status: Married    Spouse Name: N/A    Number of Children: N/A  . 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 Smoking status: Never Smoker   . Smokeless tobacco: Never Used  . Alcohol Use: No  . Drug Use: No  . Sexual Activity: Not Currently   Other Topics Concern  . Not on file   Social History Narrative  . No narrative on file     Review of Systems: General: negative for chills,  fever, night sweats or weight changes.  Cardiovascular: negative for chest pain, dyspnea on exertion, edema, orthopnea, palpitations, paroxysmal nocturnal dyspnea or shortness of breath Dermatological: negative for rash Respiratory: negative for cough or wheezing Urologic: negative for hematuria Abdominal: negative for nausea, vomiting, diarrhea, bright red blood per rectum, melena, or hematemesis Neurologic: negative for visual changes, syncope, or dizziness All other systems reviewed and are otherwise negative except as noted above.    Blood pressure 136/92, pulse 74, height 5\' 7"  (1.702 m), weight 125 lb 9.6 oz (56.972 kg).  General appearance: alert and no distress Neck: no adenopathy, no carotid bruit, no JVD, supple, symmetrical, trachea midline and thyroid not enlarged, symmetric, no tenderness/mass/nodules Lungs: clear to auscultation bilaterally Heart: regular rate and rhythm, S1, S2 normal, no murmur, click, rub or gallop Extremities: extremities normal, atraumatic, no cyanosis or edema and 2+ pedal pulses bilaterally  EKG normal sinus rhythm at 74 with occasional PVCs  ASSESSMENT AND PLAN:   Intermediate coronary syndrome Patient was admitted to the hospital 10/01/13 for one day. She ruled out for myocardial infarction. There is no evidence of pulmonary embolus. A cardiac catheterization performed by Dr. Peter Swaziland was entirely normal. Her chest pain was ruled out noncardiac  Mitral valve prolapse A 2-D echocardiogram performed recently (12/15/13) revealed moderate mitral valve prolapse with mild to moderate regurgitation and ejection fraction of 40-45%. This is clearly different than her EF at the time of cardiac catheterization 2 months prior.  We did talk about SBE prophylaxis during dental procedures. I'm going to repeat a 2-D echo in 6 months and then annually thereafter. Currently, she has no symptoms of congestive heart failure and her only size is  normal.      Runell GessJonathan J. Lennell Shanks MD Scotland County HospitalFACP,FACC,FAHA, [facility] CenterFSCAI 02/15/2014 9:08 AM

## 2014-02-15 NOTE — Assessment & Plan Note (Signed)
Patient was admitted to the hospital 10/01/13 for one day. She ruled out for myocardial infarction. There is no evidence of pulmonary embolus. A cardiac catheterization performed by Dr. Peter SwazilandJordan was entirely normal. Her chest pain was ruled out noncardiac

## 2014-02-26 IMAGING — MG MM DIGITAL SCREENING BILAT
4 series · 4 of 4 positions shown · non-contrast
Comparison: Previous exams.

CLINICAL DATA: Screening.

DIGITAL BILATERAL SCREENING MAMMOGRAM WITH CAD

[R CC]
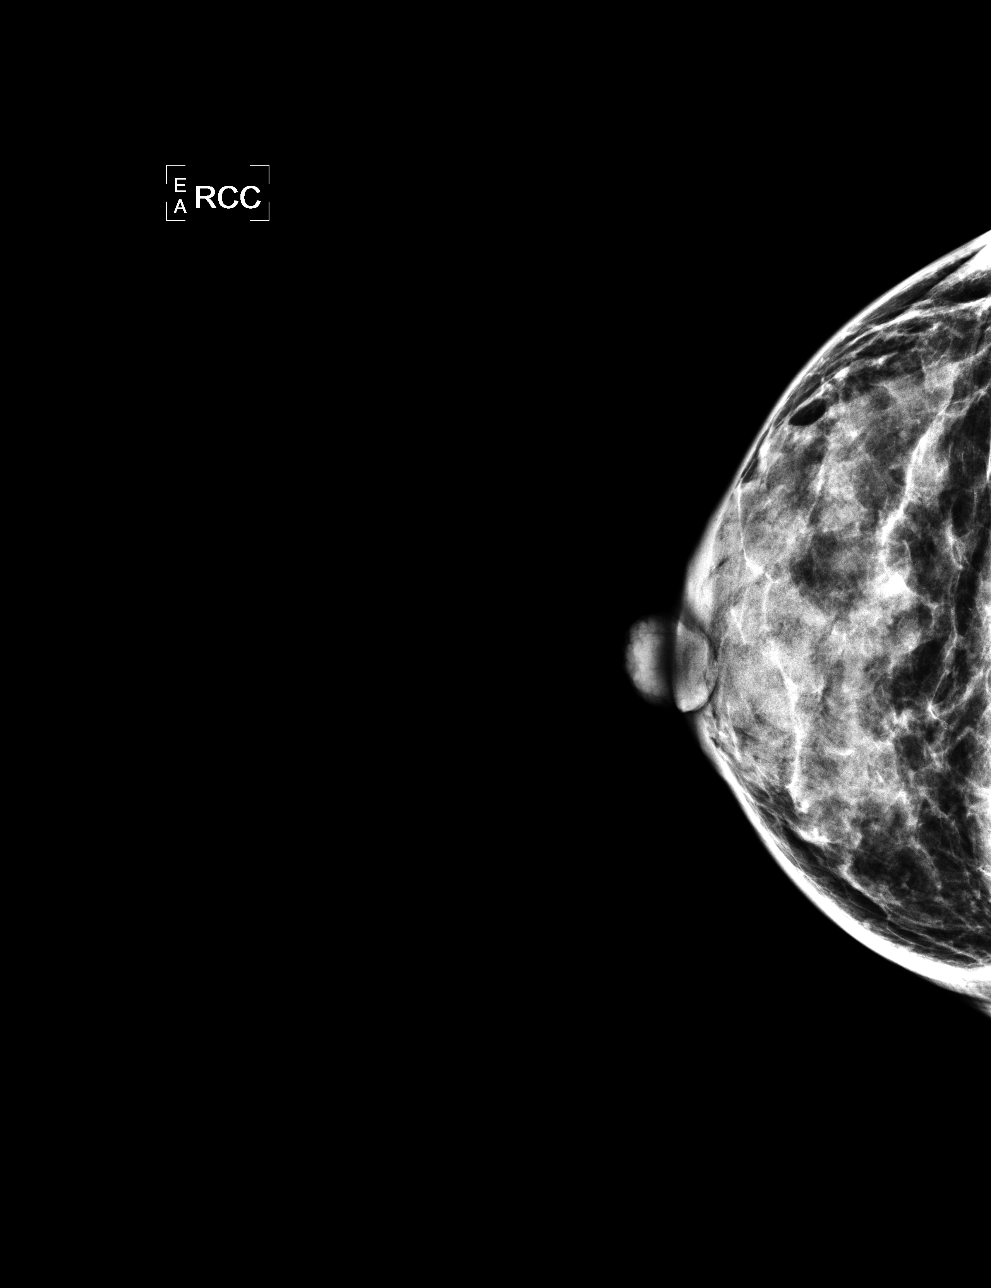

[L CC]
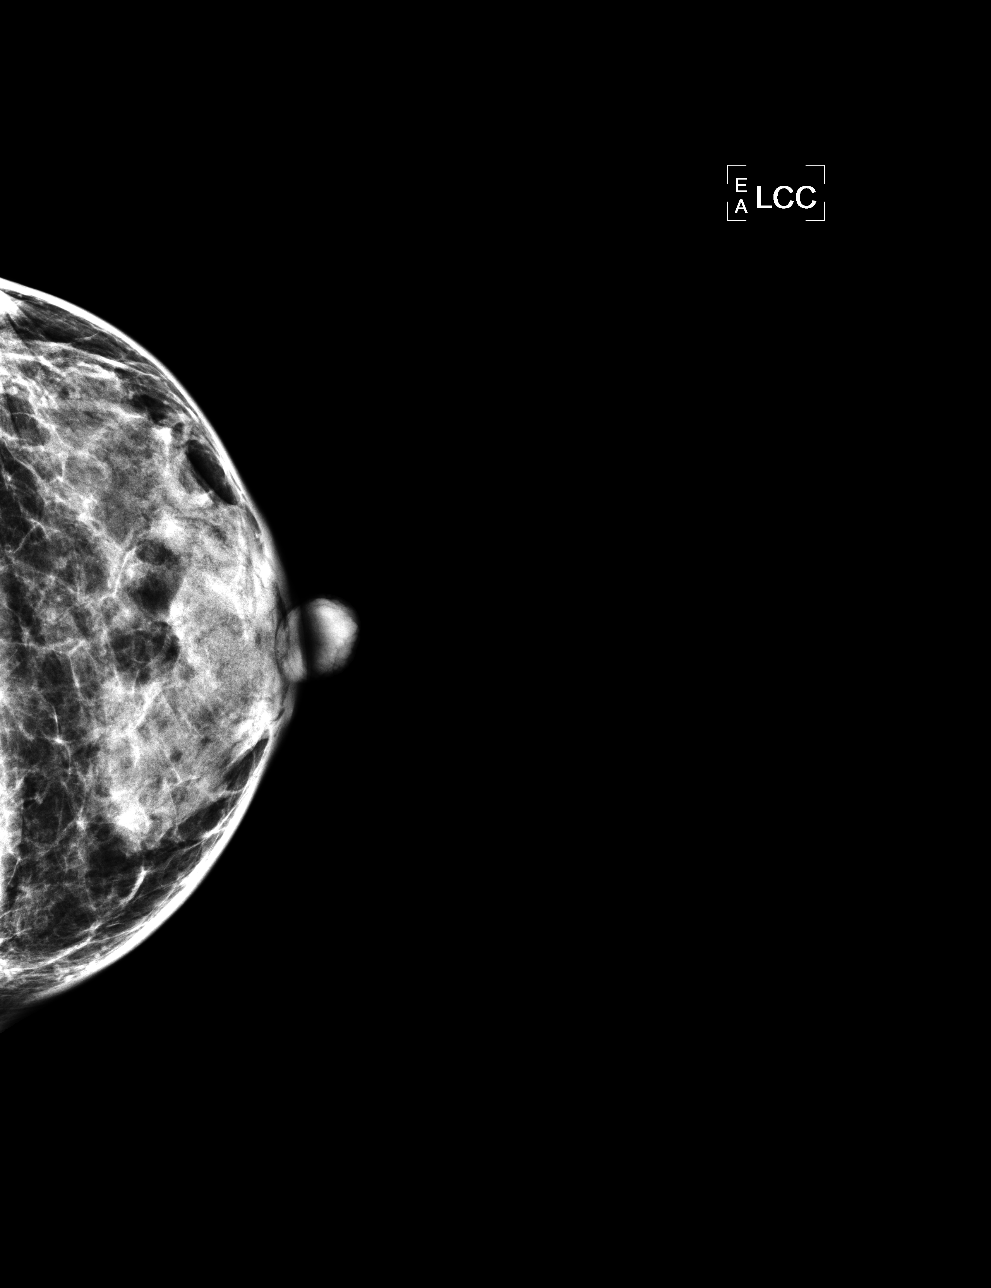

[L MLO]
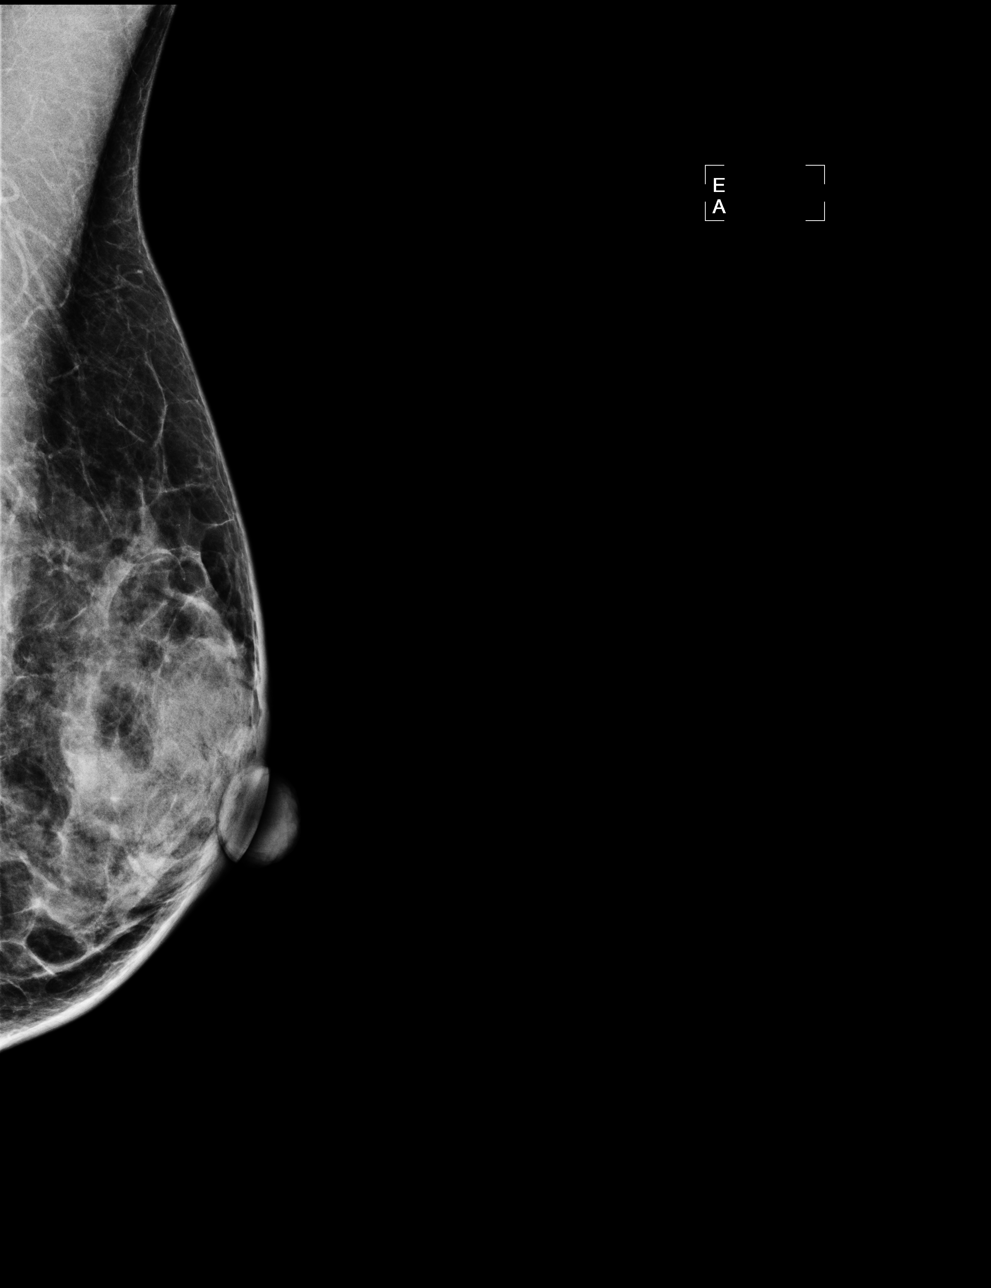

[R MLO]
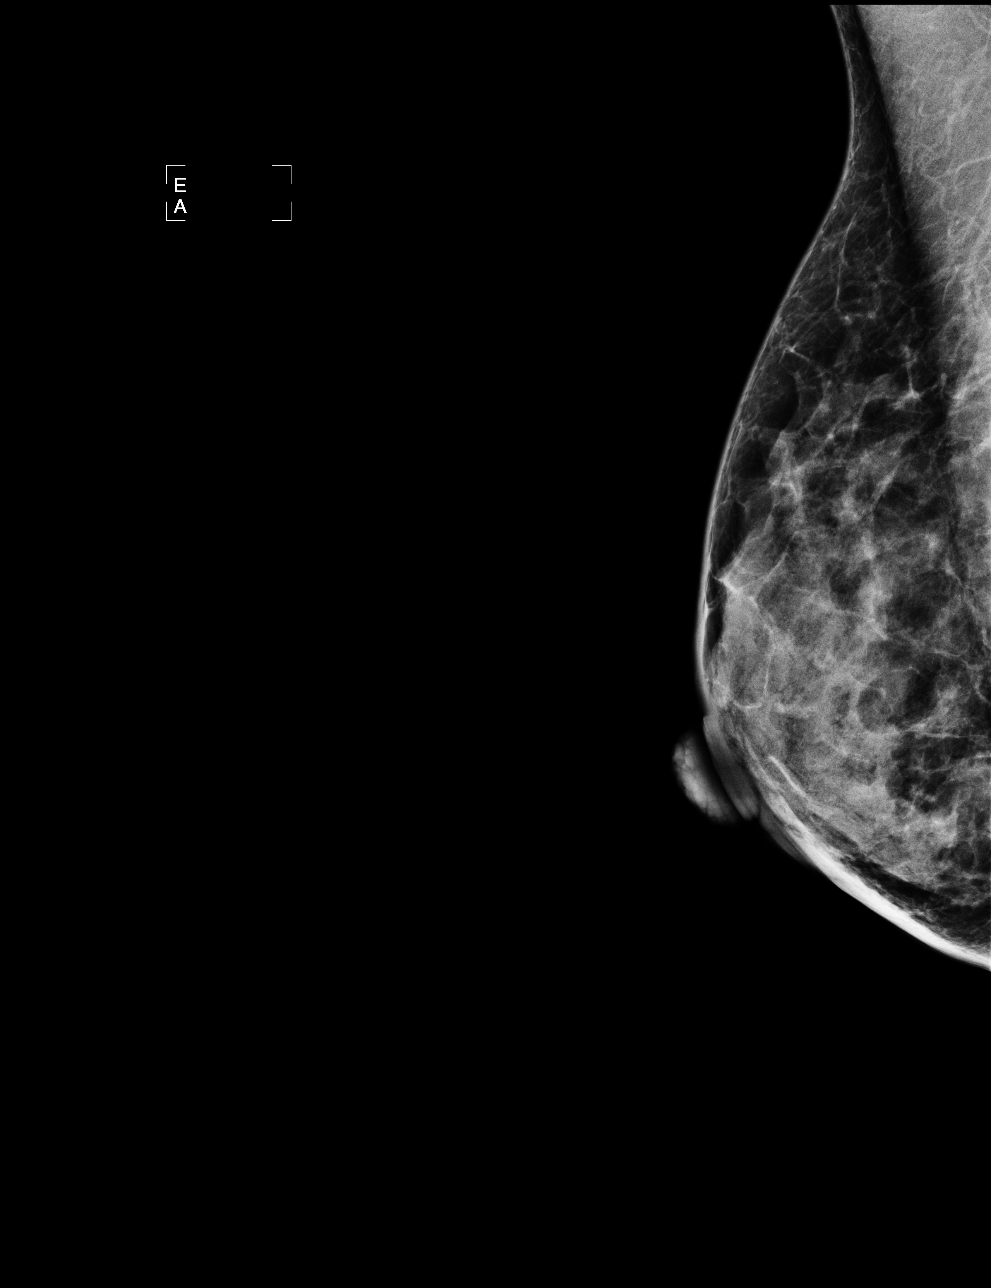

[4 of 4 positions shown; findings below may reference images not displayed]

FINDINGS: ACR Breast Density Category 3: The breast tissue is heterogeneously
dense.

No suspicious masses, architectural distortion, or calcifications
are present.

Images were processed with CAD.
IMPRESSION: No mammographic evidence of malignancy.

A result letter of this screening mammogram will be mailed directly
to the patient.

RECOMMENDATION:
Screening mammogram in one year. (Code:S5-2-VZT)

BI-RADS CATEGORY 1:  Negative.

## 2014-05-30 ENCOUNTER — Encounter: Payer: Self-pay | Admitting: Cardiovascular Disease

## 2014-07-07 ENCOUNTER — Encounter (HOSPITAL_COMMUNITY): Payer: Self-pay | Admitting: Cardiology

## 2014-12-19 ENCOUNTER — Other Ambulatory Visit: Payer: Self-pay

## 2014-12-19 DIAGNOSIS — Z1231 Encounter for screening mammogram for malignant neoplasm of breast: Secondary | ICD-10-CM

## 2014-12-27 ENCOUNTER — Ambulatory Visit
Admission: RE | Admit: 2014-12-27 | Discharge: 2014-12-27 | Disposition: A | Discharge status: Home / Self Care | Source: Ambulatory Visit

## 2014-12-27 DIAGNOSIS — Z1231 Encounter for screening mammogram for malignant neoplasm of breast: Secondary | ICD-10-CM

## 2015-02-08 ENCOUNTER — Ambulatory Visit (INDEPENDENT_AMBULATORY_CARE_PROVIDER_SITE_OTHER): Payer: TRICARE For Life (TFL) | Admitting: Gynecology

## 2015-02-08 ENCOUNTER — Encounter: Payer: Self-pay | Admitting: Gynecology

## 2015-02-08 VITALS — BP 138/86 | Ht 68.0 in | Wt 126.0 lb

## 2015-02-08 DIAGNOSIS — N952 Postmenopausal atrophic vaginitis: Secondary | ICD-10-CM | POA: Diagnosis not present

## 2015-02-08 DIAGNOSIS — Z79890 Hormone replacement therapy (postmenopausal): Secondary | ICD-10-CM | POA: Diagnosis not present

## 2015-02-08 DIAGNOSIS — Z01419 Encounter for gynecological examination (general) (routine) without abnormal findings: Secondary | ICD-10-CM | POA: Diagnosis not present

## 2015-02-08 LAB — CBC WITH DIFFERENTIAL/PLATELET
BASOS ABS: 0 10*3/uL (ref 0.0–0.1)
Basophils Relative: 1 % (ref 0–1)
EOS ABS: 0 10*3/uL (ref 0.0–0.7)
EOS PCT: 1 % (ref 0–5)
HCT: 39.3 % (ref 36.0–46.0)
Hemoglobin: 12.7 g/dL (ref 12.0–15.0)
LYMPHS ABS: 1 10*3/uL (ref 0.7–4.0)
Lymphocytes Relative: 28 % (ref 12–46)
MCH: 29.8 pg (ref 26.0–34.0)
MCHC: 32.3 g/dL (ref 30.0–36.0)
MCV: 92.3 fL (ref 78.0–100.0)
MPV: 10.6 fL (ref 8.6–12.4)
Monocytes Absolute: 0.2 10*3/uL (ref 0.1–1.0)
Monocytes Relative: 5 % (ref 3–12)
Neutro Abs: 2.4 10*3/uL (ref 1.7–7.7)
Neutrophils Relative %: 65 % (ref 43–77)
Platelets: 229 10*3/uL (ref 150–400)
RBC: 4.26 MIL/uL (ref 3.87–5.11)
RDW: 13.3 % (ref 11.5–15.5)
WBC: 3.7 10*3/uL — ABNORMAL LOW (ref 4.0–10.5)

## 2015-02-08 LAB — COMPREHENSIVE METABOLIC PANEL
ALK PHOS: 39 U/L (ref 39–117)
ALT: 16 U/L (ref 0–35)
AST: 17 U/L (ref 0–37)
Albumin: 4.1 g/dL (ref 3.5–5.2)
BUN: 13 mg/dL (ref 6–23)
CO2: 28 mEq/L (ref 19–32)
Calcium: 9.6 mg/dL (ref 8.4–10.5)
Chloride: 104 mEq/L (ref 96–112)
Creat: 1.1 mg/dL (ref 0.50–1.10)
GLUCOSE: 67 mg/dL — AB (ref 70–99)
POTASSIUM: 4.2 meq/L (ref 3.5–5.3)
Sodium: 143 mEq/L (ref 135–145)
Total Bilirubin: 0.5 mg/dL (ref 0.2–1.2)
Total Protein: 6.9 g/dL (ref 6.0–8.3)

## 2015-02-08 LAB — LIPID PANEL
CHOL/HDL RATIO: 1.7 ratio
Cholesterol: 154 mg/dL (ref 0–200)
HDL: 89 mg/dL (ref >=46)
LDL Cholesterol: 55 mg/dL (ref 0–99)
TRIGLYCERIDES: 50 mg/dL (ref <150)
VLDL: 10 mg/dL (ref 0–40)

## 2015-02-08 LAB — TSH: TSH: 1.11 u[IU]/mL (ref 0.350–4.500)

## 2015-02-08 MED ORDER — ESTRADIOL 0.05 MG/24HR TD PTTW: 1.0000 | MEDICATED_PATCH | TRANSDERMAL | Status: DC

## 2015-02-08 NOTE — Progress Notes (Signed)
Kathy Douglas 12/20/1960 [id number] Greenspan799        54 y.o.  G2P2 for annual exam.  Several issues noted below.  Past medical history,surgical history, problem list, medications, allergies, family history and social history were all reviewed and documented as reviewed in the EPIC chart.  ROS:  Performed with pertinent positives and negatives included in the history, assessment and plan.   Additional significant findings :  none   Exam: [facility] person[facility]istant Filed Vitals:   02/08/15 1050  BP: 138/86  Height: 5\' 8"  (1.727 m)  Weight: 126 lb (57.153 kg)   General appearance:  Normal affect, orientation and appearance. Skin: Grossly normal HEENT: Without gross lesions.  No cervical or supraclavicular adenopathy. Thyroid normal.  Lungs:  Clear without wheezing, rales or rhonchi Cardiac: RR, without RMG Abdominal:  Soft, nontender, without masses, guarding, rebound, organomegaly or hernia Breasts:  Examined lying and sitting without masses, retractions, discharge or axillary adenopathy. Pelvic:  Ext/BUS/vagina with atrophic changes  Adnexa  Without masses or tenderness    Anus and perineum  Normal   Rectovaginal  Normal sphincter tone without palpated masses or tenderness.    Assessment/Plan:  54 y.o. G2P2 female for annual exam.   1. Postmenopausal/atrophic genital changes.  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 On Vivelle 0.05 mg patches doing well once to continue. I again reviewed the issues to include the WHI study increased risks of stroke heart attack DVT and breast cancer. Patient is comfortable with the risks and I refilled her 1 year. 2. History of osteopenia. DEXA 2011 T score -1.4. Follow up DEXA 2014 normal. Plan repeat DEXA at 5 year interval. Increased calcium and vitamin D reviewed. Check vitamin D level today. 3. Mammography 11/2014. Continue with annual mammography when due. SBE monthly reviewed. 4. Colonoscopy 2005. Patient is overdue and she knows this. She agrees to  call and schedule. 5. Pap smear 2015. No Pap smear done today.  History of hysterectomy for benign indications in no history of abnormal Pap smears. Options to stop screening all together or less frequent screening intervals reviewed. Will readdress on an annual basis. 6. Health maintenance. Patient has to like do routine blood work. CBC, comprehensive metabolic panel, lipid profile, urinalysis, TSH and vitamin D ordered. Follow up in one year, sooner as needed.   Dara LordsFONTAINE,TIMOTHY P MD, 11:07 AM 02/08/2015

## 2015-02-08 NOTE — Patient Instructions (Signed)
Schedule your colonoscopy.  You may obtain a copy of any labs that were done today by logging onto MyChart as outlined in the instructions provided with your AVS (after visit summary). The office will not call with normal lab results but certainly if there are any significant abnormalities then we will contact you.   Health Maintenance, Female A healthy lifestyle and preventative care can promote health and wellness.  Maintain regular health, dental, and eye exams.  Eat a healthy diet. Foods like vegetables, fruits, whole grains, low-fat dairy products, and lean protein foods contain the nutrients you need without too many calories. Decrease your intake of foods high in solid fats, added sugars, and salt. Get information about a proper diet from your caregiver, if necessary.  Regular physical exercise is one of the most important things you can do for your health. Most adults should get at least 150 minutes of moderate-intensity exercise (any activity that increases your heart rate and causes you to sweat) each week. In addition, most adults need muscle-strengthening exercises on 2 or more days a week.   Maintain a healthy weight. The body mass index (BMI) is a screening tool to identify possible weight problems. It provides an estimate of body fat based on height and weight. Your caregiver can help determine your BMI, and can help you achieve or maintain a healthy weight. For adults 20 years and older:  A BMI below 18.5 is considered underweight.  A BMI of 18.5 to 24.9 is normal.  A BMI of 25 to 29.9 is considered overweight.  A BMI of 30 and above is considered obese.  Maintain normal blood lipids and cholesterol by exercising and minimizing your intake of saturated fat. Eat a balanced diet with plenty of fruits and vegetables. Blood tests for lipids and cholesterol should begin at age 55 and be repeated every 5 years. If your lipid or cholesterol levels are high, you are over 50, or you  are a high risk for heart disease, you may need your cholesterol levels checked more frequently.Ongoing high lipid and cholesterol levels should be treated with medicines if diet and exercise are not effective.  If you smoke, find out from your caregiver how to quit. If you do not use tobacco, do not start.  Lung cancer screening is recommended for adults aged 78 80 years who are at high risk for developing lung cancer because of a history of smoking. Yearly low-dose computed tomography (CT) is recommended for people who have at least a 30-pack-year history of smoking and are a current smoker or have quit within the past 15 years. A pack year of smoking is smoking an average of 1 pack of cigarettes a day for 1 year (for example: 1 pack a day for 30 years or 2 packs a day for 15 years). Yearly screening should continue until the smoker has stopped smoking for at least 15 years. Yearly screening should also be stopped for people who develop a health problem that would prevent them from having lung cancer treatment.  If you are pregnant, do not drink alcohol. If you are breastfeeding, be very cautious about drinking alcohol. If you are not pregnant and choose to drink alcohol, do not exceed 1 drink per day. One drink is considered to be 12 ounces (355 mL) of beer, 5 ounces (148 mL) of wine, or 1.5 ounces (44 mL) of liquor.  Avoid use of street drugs. Do not share needles with anyone. Ask for help if you need support  or instructions about stopping the use of drugs.  High blood pressure causes heart disease and increases the risk of stroke. Blood pressure should be checked at least every 1 to 2 years. Ongoing high blood pressure should be treated with medicines, if weight loss and exercise are not effective.  If you are 58 to 54 years old, ask your caregiver if you should take aspirin to prevent strokes.  Diabetes screening involves taking a blood sample to check your fasting blood sugar level. This should  be done once every 3 years, after age 67, if you are within normal weight and without risk factors for diabetes. Testing should be considered at a younger age or be carried out more frequently if you are overweight and have at least 1 risk factor for diabetes.  Breast cancer screening is essential preventative care for women. You should practice "breast self-awareness." This means understanding the normal appearance and feel of your breasts and may include breast self-examination. Any changes detected, no matter how small, should be reported to a caregiver. Women in their 25s and 30s should have a clinical breast exam (CBE) by a caregiver as part of a regular health exam every 1 to 3 years. After age 37, women should have a CBE every year. Starting at age 12, women should consider having a mammogram (breast X-ray) every year. Women who have a family history of breast cancer should talk to their caregiver about genetic screening. Women at a high risk of breast cancer should talk to their caregiver about having an MRI and a mammogram every year.  Breast cancer gene (BRCA)-related cancer risk assessment is recommended for women who have family members with BRCA-related cancers. BRCA-related cancers include breast, ovarian, tubal, and peritoneal cancers. Having family members with these cancers may be associated with an increased risk for harmful changes (mutations) in the breast cancer genes BRCA1 and BRCA2. Results of the assessment will determine the need for genetic counseling and BRCA1 and BRCA2 testing.  The Pap test is a screening test for cervical cancer. Women should have a Pap test starting at age 47. Between ages 60 and 16, Pap tests should be repeated every 2 years. Beginning at age 6, you should have a Pap test every 3 years as long as the past 3 Pap tests have been normal. If you had a hysterectomy for a problem that was not cancer or a condition that could lead to cancer, then you no longer need  Pap tests. If you are between ages 19 and 37, and you have had normal Pap tests going back 10 years, you no longer need Pap tests. If you have had past treatment for cervical cancer or a condition that could lead to cancer, you need Pap tests and screening for cancer for at least 20 years after your treatment. If Pap tests have been discontinued, risk factors (such as a new sexual partner) need to be reassessed to determine if screening should be resumed. Some women have medical problems that increase the chance of getting cervical cancer. In these cases, your caregiver may recommend more frequent screening and Pap tests.  The human papillomavirus (HPV) test is an additional test that may be used for cervical cancer screening. The HPV test looks for the virus that can cause the cell changes on the cervix. The cells collected during the Pap test can be tested for HPV. The HPV test could be used to screen women aged 24 years and older, and should be used in  women of any age who have unclear Pap test results. After the age of 68, women should have HPV testing at the same frequency as a Pap test.  Colorectal cancer can be detected and often prevented. Most routine colorectal cancer screening begins at the age of 49 and continues through age 70. However, your caregiver may recommend screening at an earlier age if you have risk factors for colon cancer. On a yearly basis, your caregiver may provide home test kits to check for hidden blood in the stool. Use of a small camera at the end of a tube, to directly examine the colon (sigmoidoscopy or colonoscopy), can detect the earliest forms of colorectal cancer. Talk to your caregiver about this at age 62, when routine screening begins. Direct examination of the colon should be repeated every 5 to 10 years through age 62, unless early forms of pre-cancerous polyps or small growths are found.  Hepatitis C blood testing is recommended for all people born from [birth year] through  1965 and any individual with known risks for hepatitis C.  Practice safe sex. Use condoms and avoid high-risk sexual practices to reduce the spread of sexually transmitted infections (STIs). Sexually active women aged 55 and younger should be checked for Chlamydia, which is a common sexually transmitted infection. Older women with new or multiple partners should also be tested for Chlamydia. Testing for other STIs is recommended if you are sexually active and at increased risk.  Osteoporosis is a disease in which the bones lose minerals and strength with aging. This can result in serious bone fractures. The risk of osteoporosis can be identified using a bone density scan. Women ages 24 and over and women at risk for fractures or osteoporosis should discuss screening with their caregivers. Ask your caregiver whether you should be taking a calcium supplement or vitamin D to reduce the rate of osteoporosis.  Menopause can be associated with physical symptoms and risks. Hormone replacement therapy is available to decrease symptoms and risks. You should talk to your caregiver about whether hormone replacement therapy is right for you.  Use sunscreen. Apply sunscreen liberally and repeatedly throughout the day. You should seek shade when your shadow is shorter than you. Protect yourself by wearing long sleeves, pants, a wide-brimmed hat, and sunglasses year round, whenever you are outdoors.  Notify your caregiver of new moles or changes in moles, especially if there is a change in shape or color. Also notify your caregiver if a mole is larger than the size of a pencil eraser.  Stay current with your immunizations. Document Released: 01/28/2011 Document Revised: 11/09/2012 Document Reviewed: 01/28/2011 [facility] Patient Information 2014 [employer].

## 2015-02-09 LAB — URINALYSIS W MICROSCOPIC + REFLEX CULTURE
Bacteria, UA: NONE SEEN
Bilirubin Urine: NEGATIVE
CASTS: NONE SEEN
Crystals: NONE SEEN
Glucose, UA: NEGATIVE mg/dL
Hgb urine dipstick: NEGATIVE
Ketones, ur: NEGATIVE mg/dL
Leukocytes, UA: NEGATIVE
Nitrite: NEGATIVE
PROTEIN: NEGATIVE mg/dL
SQUAMOUS EPITHELIAL / LPF: NONE SEEN
Specific Gravity, Urine: 1.015 (ref 1.005–1.030)
Urobilinogen, UA: 0.2 mg/dL (ref 0.0–1.0)
pH: 5 (ref 5.0–8.0)

## 2015-02-09 LAB — VITAMIN D 25 HYDROXY (VIT D DEFICIENCY, FRACTURES): Vit D, 25-Hydroxy: 30 ng/mL (ref 30–100)

## 2015-05-02 ENCOUNTER — Encounter: Payer: Self-pay | Admitting: Cardiovascular Disease

## 2015-05-02 ENCOUNTER — Ambulatory Visit (INDEPENDENT_AMBULATORY_CARE_PROVIDER_SITE_OTHER): Admitting: Cardiovascular Disease

## 2015-05-02 VITALS — BP 146/100 | HR 72 | Ht 67.0 in | Wt 127.0 lb

## 2015-05-02 DIAGNOSIS — I10 Essential (primary) hypertension: Secondary | ICD-10-CM | POA: Insufficient documentation

## 2015-05-02 DIAGNOSIS — R011 Cardiac murmur, unspecified: Secondary | ICD-10-CM

## 2015-05-02 DIAGNOSIS — I341 Nonrheumatic mitral (valve) prolapse: Secondary | ICD-10-CM

## 2015-05-02 DIAGNOSIS — I200 Unstable angina: Secondary | ICD-10-CM

## 2015-05-02 NOTE — Patient Instructions (Signed)
Medication Instructions:  Your physician recommends that you continue on your current medications as directed. Please refer to the Current Medication list given to you today.   Labwork: none  Testing/Procedures: Your physician has requested that you have an echocardiogram. Echocardiography is a painless test that uses sound waves to create images of your heart. It provides your doctor with information about the size and shape of your heart and how well your heart's chambers and valves are working. This procedure takes approximately one hour. There are no restrictions for this procedure.   Follow-Up:  Your physician recommends that you schedule a follow-up appointment in: 1 month with Kristin in [facility]  Your physician wants you to follow-up in: 12 months with Dr. San Morelle will receive a reminder letter in the mail two months in advance. If you don't receive a letter, please call our office to schedule the follow-up appointment.   Any Other Special Instructions Will Be Listed Below (If Applicable).  Your physician has requested that you monitor and record your blood pressure readings daily at home. Please use the same machine at the same time of day to check your readings and record them to bring to your follow-up visit.

## 2015-05-02 NOTE — Assessment & Plan Note (Signed)
History of bileaflet mitral valve prolapse demonstrated by 2-D echo 12/17/13 with mild to moderate MR and an LVEF of 40-45%. She is completely asymptomatic. I do not hear a murmur on exam today. I do hear a systolic click however. I'm not repeat her 2-D echocardiogram

## 2015-05-02 NOTE — Progress Notes (Signed)
05/02/2015 Kathy Douglas   1961/02/22  [id number]  Primary Physician Allean Found, MD Primary Cardiologist: Runell Gess MD Roseanne Reno   HPI:  Kathy Douglas is a delightful 54 year old thin and thick appearing married African American female mother of 2 children works in Audiological scientist at [employer]. 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 Merri Brunette for evaluation of murmur and mitral valve prolapse demonstrated on recent 2-D echo. I last saw her in the office 02/15/14. She has no cardiac risk factors other than family history. Father died of myocardial infarction at age 43 and her brother had an MI at age 3. She's never had a heart attack or stroke. She denies chest pain but does have mild dyspnea. She was hospitalized from 10/01/13 overnight for chest pain and underwent cardiac catheterization by Dr. Sw[place]and revealing normal coronaries and normal left ventricular function. A 2-D echocardiogram performed 12/17/13 revealed moderate mitral valve prolapse, mild to moderate MR with moderate LV dysfunction. She is relatively asymptomatic.  Current Outpatient Prescriptions  Medication Sig Dispense Refill  . Cholecalciferol (VITAMIN D3) 1000 UNITS CAPS Take 1,000 Units by mouth daily.    Marland Kitchen estradiol (VIVELLE-DOT) 0.05 MG/24HR patch Place 1 patch (0.05 mg total) onto the skin 2 (two) times a week. 24 patch 4  . Multiple Vitamins-Iron (MULTIVITAMIN/IRON PO) Take by mouth.       No current facility-administered medications for this visit.    No Known Allergies  Social History   Social History  . Marital Status: Married    Spouse Name: N/A  . Number of Children: N/A  . 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 Smoking status: Never Smoker   . Smokeless tobacco: Never Used  . Alcohol Use: No  . Drug Use: No  . Sexual Activity: Not Currently     Comment: 1st intercourse 15 yo-5 partners   Other  Topics Concern  . Not on file   Social History Narrative     Review of Systems: General: negative for chills, fever, night sweats or weight changes.  Cardiovascular: negative for chest pain, dyspnea on exertion, edema, orthopnea, palpitations, paroxysmal nocturnal dyspnea or shortness of breath Dermatological: negative for rash Respiratory: negative for cough or wheezing Urologic: negative for hematuria Abdominal: negative for nausea, vomiting, diarrhea, bright red blood per rectum, melena, or hematemesis Neurologic: negative for visual changes, syncope, or dizziness All other systems reviewed and are otherwise negative except as noted above.    Blood pressure 146/100, pulse 72, height  (1.702 m), weight 127 lb (57.607 kg).  General appearance: alert and no distress Neck: no adenopathy, no carotid bruit, no JVD, supple, symmetrical, trachea midline and thyroid not enlarged, symmetric, no tenderness/mass/nodules Lungs: clear to auscultation bilaterally Heart: regular rate and rhythm, S1, S2 normal, no murmur, click, rub or gallop Extremities: extremities normal, atraumatic, no cyanosis or edema  EKG normalization of 72 with occasional PVCs. I personally reviewed this EKG  ASSESSMENT AND PLAN:   Essential hypertension No prior history of hypertension although blood pressure today was measured at 146/100. I've asked her to keep a blood pressure log over the next 4 weeks. She'll see Belenda Cruise back in the office to review this and make further recommendations  Mitral valve prolapse History of bileaflet mitral valve prolapse demonstrated by 2-D echo 12/17/13 with mild to moderate MR and an LVEF of 40-45%. She is completely  asymptomatic. I do not hear a murmur on exam today. I do hear a systolic click however. I'm not repeat her 2-D echocardiogram      Runell Gess MD [facility], [facility] 05/02/2015 12:32 PM

## 2015-05-02 NOTE — Assessment & Plan Note (Signed)
No prior history of hypertension although blood pressure today was measured at 146/100. I've asked her to keep a blood pressure log over the next 4 weeks. She'll see Belenda Cruise back in the office to review this and make further recommendations

## 2015-05-17 ENCOUNTER — Other Ambulatory Visit: Payer: Self-pay

## 2015-05-17 ENCOUNTER — Ambulatory Visit (HOSPITAL_COMMUNITY): Attending: Cardiology

## 2015-05-17 DIAGNOSIS — I340 Nonrheumatic mitral (valve) insufficiency: Secondary | ICD-10-CM | POA: Insufficient documentation

## 2015-05-17 DIAGNOSIS — R011 Cardiac murmur, unspecified: Secondary | ICD-10-CM | POA: Diagnosis not present

## 2015-05-17 DIAGNOSIS — Z8249 Family history of ischemic heart disease and other diseases of the circulatory system: Secondary | ICD-10-CM | POA: Diagnosis not present

## 2015-05-17 DIAGNOSIS — I10 Essential (primary) hypertension: Secondary | ICD-10-CM | POA: Diagnosis not present

## 2015-05-17 DIAGNOSIS — I491 Atrial premature depolarization: Secondary | ICD-10-CM | POA: Diagnosis not present

## 2015-05-17 DIAGNOSIS — I341 Nonrheumatic mitral (valve) prolapse: Secondary | ICD-10-CM | POA: Diagnosis not present

## 2015-06-08 ENCOUNTER — Ambulatory Visit (INDEPENDENT_AMBULATORY_CARE_PROVIDER_SITE_OTHER): Admitting: Pharmacist Clinician (PhC)/ Clinical Pharmacy Specialist

## 2015-06-08 VITALS — BP 132/90 | HR 88 | Ht 67.0 in | Wt 129.0 lb

## 2015-06-08 DIAGNOSIS — I10 Essential (primary) hypertension: Secondary | ICD-10-CM

## 2015-06-08 NOTE — Patient Instructions (Signed)
  Your blood pressure today is 132/90  (goal is < 140/90)  Check your blood pressure at home 2-3 times each week and keep record of the readings.  Exercise as you're able, try to walk approximately 30 minutes per day.  Keep salt intake to a minimum, especially watch canned and prepared boxed foods.  Eat more fresh fruits and vegetables and fewer canned items.  Avoid eating in fast food restaurants.    HOW TO TAKE YOUR BLOOD PRESSURE: . Rest 5 minutes before taking your blood pressure. .  Don't smoke or drink caffeinated beverages for at least 30 minutes before. . Take your blood pressure before (not after) you eat. . Sit comfortably with your back supported and both feet on the floor (don't cross your legs). . Elevate your arm to heart level on a table or a desk. . Use the proper sized cuff. It should fit smoothly and snugly around your bare upper arm. There should be enough room to slip a fingertip under the cuff. The bottom edge of the cuff should be 1 inch above the crease of the elbow. . Ideally, take 3 measurements at one sitting and record the average.

## 2015-06-09 ENCOUNTER — Encounter: Payer: Self-pay | Admitting: Pharmacist Clinician (PhC)/ Clinical Pharmacy Specialist

## 2015-06-09 NOTE — Progress Notes (Signed)
     06/09/2015 Abner Greenspanaula V Obarr 01/15/1961 [id number]   HPI:  Abner Greenspanaula V Haymaker is a 54 y.o. female patient of Dr Allyson SabalBerry, with a PMH below who presents today for hypertension clinic evaluation.  Ms Charm BargesButler does not have a prior history of hypertension, but when she saw Dr. Allyson SabalBerry in October had a reading of 146/100.  She tells me that she has never taken any medications for hypertension, but has been told several times that her readings are at the high end of normal and she will probably need some medication for this in the future.  She is recently retired from [employer] and bought a new BP cuff last month when Dr. Allyson SabalBerry asked her to monitor her home BP readings.  She did not bring the cuff with her today.  Cardiac Hx: mitral valve prolapse  Family Hx: both parents and 1 sibling with hypertension, mother still living in her 380's, father died from MI in early 6770's  Social Hx: does not use tobacco or alcohol, drinks only 1 cup of coffee per day, no other regular caffeine  Diet: does not cook with or add salt to foods, eats out on weekends occasionally  Exercise: walks 15 minutes qod, uses 5 pound weights and does squats and other exercises regularly  Home BP readings: took home BP daily, systolic readings all WNL, highest at 133, lowest 100; diastolic readings 79-91, with only 3 of 32 at 90-91.    Current antihypertensive medications: none   Current Outpatient Prescriptions  Medication Sig Dispense Refill  . Cholecalciferol (VITAMIN D3) 1000 UNITS CAPS Take 1,000 Units by mouth daily.    Marland Kitchen. estradiol (VIVELLE-DOT) 0.05 MG/24HR patch Place 1 patch (0.05 mg total) onto the skin 2 (two) times a week. 24 patch 4  . Multiple Vitamins-Iron (MULTIVITAMIN/IRON PO) Take by mouth.       No current facility-administered medications for this visit.    No Known Allergies  Past Medical History  Diagnosis Date  . Refusal of blood transfusions as patient is Jehovah's Witness     Jehovah witness  . Osteopenia  06/2010    T score -1.44 of the hip Followup DEXA 2014 normal.  . Simple hepatic cyst     a. incidentally noted on CT 09/2013  . Chest pain     a. 09/2013 Cath: nl cors, EF 55-65%.  . Mitral valve prolapse     mild to moderate mitral regurgitation, moderate LV dysfunction    Blood pressure 132/90, pulse 88, height 5\' 7"  (1.702 m), weight 129 lb (58.514 kg).    Phillips HayKristin Ranata Laughery PharmD CPP [facility]

## 2015-06-09 NOTE — Assessment & Plan Note (Signed)
Today her BP is at 132/90.  We had a long discussion about the possible future need for antihypertensive medication.  For now I have asked her to continue monitoring her home BP readings 2-3 times each week.  She will continue to exercise and maintain a low sodium diet.  She is to call the office if she notices a increase trend in her BP readings, especially if the diastolic readings consistently rise to > 90.

## 2015-12-05 ENCOUNTER — Other Ambulatory Visit: Payer: Self-pay

## 2015-12-05 DIAGNOSIS — Z1231 Encounter for screening mammogram for malignant neoplasm of breast: Secondary | ICD-10-CM

## 2016-01-02 ENCOUNTER — Ambulatory Visit
Admission: RE | Admit: 2016-01-02 | Discharge: 2016-01-02 | Disposition: A | Discharge status: Home / Self Care | Source: Ambulatory Visit

## 2016-01-02 DIAGNOSIS — Z1231 Encounter for screening mammogram for malignant neoplasm of breast: Secondary | ICD-10-CM

## 2016-02-22 ENCOUNTER — Encounter: Payer: Self-pay | Admitting: Gynecology

## 2016-02-22 ENCOUNTER — Ambulatory Visit (INDEPENDENT_AMBULATORY_CARE_PROVIDER_SITE_OTHER): Payer: TRICARE For Life (TFL) | Admitting: Gynecology

## 2016-02-22 VITALS — BP 116/74 | Ht 68.0 in | Wt 126.0 lb

## 2016-02-22 DIAGNOSIS — Z9229 Personal history of other drug therapy: Secondary | ICD-10-CM

## 2016-02-22 DIAGNOSIS — Z01419 Encounter for gynecological examination (general) (routine) without abnormal findings: Secondary | ICD-10-CM | POA: Diagnosis not present

## 2016-02-22 DIAGNOSIS — N952 Postmenopausal atrophic vaginitis: Secondary | ICD-10-CM

## 2016-02-22 DIAGNOSIS — R21 Rash and other nonspecific skin eruption: Secondary | ICD-10-CM

## 2016-02-22 MED ORDER — ESTRADIOL 0.05 MG/24HR TD PTTW: 1.0000 | MEDICATED_PATCH | TRANSDERMAL | 4 refills | Status: DC

## 2016-02-22 MED ORDER — BETAMETHASONE DIPROPIONATE AUG 0.05 % EX CREA: TOPICAL_CREAM | Freq: Two times a day (BID) | CUTANEOUS | 0 refills | Status: DC

## 2016-02-22 NOTE — Progress Notes (Signed)
    Kathy Douglas 07/16/1961 [id number]        55 y.o.  G2P2  for annual exam.  Doing well. Several issues noted below.  Past medical history,surgical history, problem list, medications, allergies, family history and social history were all reviewed and documented as reviewed in the EPIC chart.  ROS:  Performed with pertinent positives and negatives included in the history, assessment and plan.   Additional significant findings :  None   Exam: [name] assistant Vitals:   02/22/16 0921  BP: 116/74  Weight: 126 lb (57.2 kg)  Height: 5\' 8"  (1.727 m)   General appearance:  Normal affect, orientation and appearance. Skin: Grossly normal excepting 2 patches of scaly rash over her iliac crests bilaterally. No evidence of cellulitis or weeping.  HEENT: Without gross lesions.  No cervical or supraclavicular adenopathy. Thyroid normal.  Lungs:  Clear without wheezing, rales or rhonchi Cardiac: RR, without RMG Abdominal:  Soft, nontender, without masses, guarding, rebound, organomegaly or hernia Breasts:  Examined lying and sitting without masses, retractions, discharge or axillary adenopathy. Pelvic:  Ext/BUS/Vagina with atrophic changes  Adnexa without masses or tenderness    Anus and perineum normal   Rectovaginal normal sphincter tone without palpated masses or tenderness.    Assessment/Plan:  55 y.o. G2P2 female for annual exam.   1. Skin rash 2 isolated areas over her iliac crests. No other symptoms or rashes elsewhere.  Very pruritic to the patient. No exposure history to allergens or changes in soap or clothing. Appears as a superficial eczema although unusual in its location. We'll treat with Diprolene 0.05% cream twice daily 30 g dispense. If persists then recommended patient make an appointment to see a dermatologist and she agrees with this. 2. Postmenopausal/atrophic genital changes. Continues on 5L.05 milligram patches. 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 I  reviewed the latest 2017 NAMS guidelines for HRT. Possible benefits from starting early to include cardiovascular and bone health as well as symptom relief. Possible risks to include thrombosis breast cancer reviewed. At this point patient wants to continue understanding and accepting the risks and refill 1 year provided. 3. Colonoscopy 2016. Repeat at their recommended interval. 4. Mammography 12/2015. Continue with annual mammography when due. SBE monthly reviewed. 5. Pap smear 2015. No Pap smear done today. No history of significant abnormal Pap smears. Discussed options to stop screening based on current screening guidelines and hysterectomy history. Will readdress on an annual basis. 6. History of osteopenia 2011 with T score -1.4. Follow up DEXA 2014 normal. Plan repeat DEXA at age 56 given she is continuing on ERT. Increased calcium vitamin D reviewed. 7. Health maintenance. Patient reports recent blood work done elsewhere. Follow up 1 year, sooner as needed.   10 minutes of my time in excess of her breast and pelvic exam was spent in direct face to face counseling and coordination of care in regards to her problems of skin rash.   Dara Lords MD, 9:42 AM 02/22/2016

## 2016-02-22 NOTE — Patient Instructions (Signed)

## 2016-05-21 ENCOUNTER — Encounter: Payer: Self-pay | Admitting: Cardiovascular Disease

## 2016-05-21 ENCOUNTER — Ambulatory Visit (INDEPENDENT_AMBULATORY_CARE_PROVIDER_SITE_OTHER): Admitting: Cardiovascular Disease

## 2016-05-21 VITALS — BP 148/76 | HR 74 | Ht 67.0 in | Wt 127.0 lb

## 2016-05-21 DIAGNOSIS — I10 Essential (primary) hypertension: Secondary | ICD-10-CM | POA: Diagnosis not present

## 2016-05-21 DIAGNOSIS — I341 Nonrheumatic mitral (valve) prolapse: Secondary | ICD-10-CM

## 2016-05-21 NOTE — Progress Notes (Signed)
05/21/2016 Abner GreenspanPaula V Mednick   04/08/1961  [id number]  Primary Physician Allean FoundSMITH,CANDACE THIELE, MD Primary Cardiologist: Runell GessJonathan J Ambrie Carte MD Roseanne RenoFACP, FACC, FAHA, FSCAI  HPI:  Ms. 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 Merri Brunetteandace Smith for evaluation of murmur and mitral valve prolapse demonstrated on recent 2-D echo. I last saw her in the office 02/15/14. She has no cardiac risk factors other than family history. Father died of myocardial infarction at age 55 and her brother had an MI at age 55. She's never had a heart attack or stroke. She denies chest pain but does have mild dyspnea. She was hospitalized from 10/01/13 overnight for chest pain and underwent cardiac catheterization by Dr. SwazilandJordan revealing normal coronaries and normal left ventricular function. A 2-D echocardiogram performed 05/17/15 revealed bileaflet mitral valve prolapse, trivial mitral regurgitation and mild LV dysfunction with an EF of 45-50%. She is completely asymptomatic.     Current Outpatient Prescriptions  Medication Sig Dispense Refill  . augmented betamethasone dipropionate (DIPROLENE AF) 0.05 % cream Apply topically 2 (two) times daily. 30 g 0  . Cholecalciferol (VITAMIN D3) 1000 UNITS CAPS Take 1,000 Units by mouth daily.    Marland Kitchen. estradiol (VIVELLE-DOT) 0.05 MG/24HR patch Place 1 patch (0.05 mg total) onto the skin 2 (two) times a week. 24 patch 4  . Multiple Vitamins-Iron (MULTIVITAMIN/IRON PO) Take by mouth.       No current facility-administered medications for this visit.     No Known Allergies  Social History   Social History  . Marital status: Married    Spouse name: N/A  . Number of children: N/A  . Years of education: N/A   Occupational History  .  [employer]    Accounting   Social History Main Topics  . Smoking status: Never  Smoker  . Smokeless tobacco: Never Used  . Alcohol use No  . Drug use: No  . Sexual activity: Not Currently     Comment: 1st intercourse 15 yo-5 partners   Other Topics Concern  . Not on file   Social History Narrative  . No narrative on file     Review of Systems: General: negative for chills, fever, night sweats or weight changes.  Cardiovascular: negative for chest pain, dyspnea on exertion, edema, orthopnea, palpitations, paroxysmal nocturnal dyspnea or shortness of breath Dermatological: negative for rash Respiratory: negative for cough or wheezing Urologic: negative for hematuria Abdominal: negative for nausea, vomiting, diarrhea, bright red blood per rectum, melena, or hematemesis Neurologic: negative for visual changes, syncope, or dizziness All other systems reviewed and are otherwise negative except as noted above.    Blood pressure (!) 148/76, pulse 74, height 5\' 7"  (1.702 m), weight 127 lb (57.6 kg).  General appearance: alert and no distress Neck: no adenopathy, no carotid bruit, no JVD, supple, symmetrical, trachea midline and thyroid not enlarged, symmetric, no tenderness/mass/nodules Lungs: clear to auscultation bilaterally Heart: regular rate and rhythm, S1, S2 normal, no murmur, click, rub or gallop Extremities: extremities normal, atraumatic, no cyanosis or edema  EKG normal sinus rhythm at 74 with marked sinus arrhythmia. I personally reviewed this EKG  ASSESSMENT AND PLAN:   Mitral valve prolapse History of bileaflet prolapse with trivial mitral regurgitation and EF of 45-50% by 2-D echo 05/17/15. We will repeat a 2-D echocardiogram.  Essential hypertension History  of hypertension blood pressure measured 148/76. She is not on antihypertensive medications.      Runell Gess MD FACP,FACC,FAHA, [facility] 05/21/2016 3:38 PM

## 2016-05-21 NOTE — Assessment & Plan Note (Signed)
History of hypertension blood pressure measured 148/76. She is not on antihypertensive medications.

## 2016-05-21 NOTE — Patient Instructions (Signed)
Medication Instructions:  Continue current medications.   Labwork: Labwork will be requested from your primary care physician.   Testing/Procedures: Your physician has requested that you have an echocardiogram. Echocardiography is a painless test that uses sound waves to create images of your heart. It provides your doctor with information about the size and shape of your heart and how well your heart's chambers and valves are working. This procedure takes approximately one hour. There are no restrictions for this procedure.    Follow-Up: Your physician wants you to follow-up in: 12 MONTHS WITH DR Allyson SabalBERRY.  You will receive a reminder letter in the mail two months in advance. If you don't receive a letter, please call our office to schedule the follow-up appointment.   If you need a refill on your cardiac medications before your next appointment, please call your pharmacy.

## 2016-05-21 NOTE — Assessment & Plan Note (Signed)
History of bileaflet prolapse with trivial mitral regurgitation and EF of 45-50% by 2-D echo 05/17/15. We will repeat a 2-D echocardiogram.

## 2016-06-11 ENCOUNTER — Other Ambulatory Visit: Payer: Self-pay

## 2016-06-11 ENCOUNTER — Ambulatory Visit (HOSPITAL_COMMUNITY): Attending: Cardiovascular Disease

## 2016-06-11 DIAGNOSIS — I341 Nonrheumatic mitral (valve) prolapse: Secondary | ICD-10-CM

## 2016-06-11 DIAGNOSIS — I10 Essential (primary) hypertension: Secondary | ICD-10-CM | POA: Diagnosis not present

## 2016-06-11 DIAGNOSIS — I5031 Acute diastolic (congestive) heart failure: Secondary | ICD-10-CM | POA: Diagnosis not present

## 2016-06-11 DIAGNOSIS — I313 Pericardial effusion (noninflammatory): Secondary | ICD-10-CM | POA: Insufficient documentation

## 2016-06-11 DIAGNOSIS — I340 Nonrheumatic mitral (valve) insufficiency: Secondary | ICD-10-CM | POA: Diagnosis not present

## 2016-12-16 ENCOUNTER — Other Ambulatory Visit: Payer: Self-pay | Admitting: Gynecology

## 2016-12-16 DIAGNOSIS — Z1231 Encounter for screening mammogram for malignant neoplasm of breast: Secondary | ICD-10-CM

## 2017-01-07 ENCOUNTER — Other Ambulatory Visit: Payer: Self-pay | Admitting: Gynecology

## 2017-01-07 ENCOUNTER — Ambulatory Visit
Admission: RE | Admit: 2017-01-07 | Discharge: 2017-01-07 | Disposition: A | Discharge status: Home / Self Care | Source: Ambulatory Visit | Attending: Gynecology | Admitting: Gynecology

## 2017-01-07 DIAGNOSIS — Z1231 Encounter for screening mammogram for malignant neoplasm of breast: Secondary | ICD-10-CM

## 2017-02-27 ENCOUNTER — Encounter: Payer: TRICARE For Life (TFL) | Admitting: Gynecology

## 2017-03-04 ENCOUNTER — Ambulatory Visit (INDEPENDENT_AMBULATORY_CARE_PROVIDER_SITE_OTHER): Payer: TRICARE For Life (TFL) | Admitting: Gynecology

## 2017-03-04 ENCOUNTER — Encounter: Payer: Self-pay | Admitting: Gynecology

## 2017-03-04 VITALS — BP 132/80 | Ht 68.0 in | Wt 128.0 lb

## 2017-03-04 DIAGNOSIS — N952 Postmenopausal atrophic vaginitis: Secondary | ICD-10-CM | POA: Diagnosis not present

## 2017-03-04 DIAGNOSIS — N951 Menopausal and female climacteric states: Secondary | ICD-10-CM

## 2017-03-04 DIAGNOSIS — Z01411 Encounter for gynecological examination (general) (routine) with abnormal findings: Secondary | ICD-10-CM | POA: Diagnosis not present

## 2017-03-04 NOTE — Addendum Note (Signed)
Addended by: Dayna BarkerGARDNER, Theodoros Stjames K on: 03/04/2017 12:34 PM   Modules accepted: Orders

## 2017-03-04 NOTE — Progress Notes (Signed)
    Kathy Douglas 12/11/1960 [id number]        56 y.o.  G2P2 for annual exam.  Had been on estradiol patches but discontinued due to skin irritation in April. Still having some hot flushes but overall doing well.  Past medical history,surgical history, problem list, medications, allergies, family history and social history were all reviewed and documented as reviewed in the EPIC chart.  ROS:  Performed with pertinent positives and negatives included in the history, assessment and plan.   Additional significant findings :  None   Exam: Kathy Douglas assistant Vitals:   03/04/17 1124  BP: 132/80  Weight: 128 lb (58.1 kg)  Height: 5\' 8"  (1.727 m)   Body mass index is 19.46 kg/m.  General appearance:  Normal affect, orientation and appearance. Skin: Grossly normal HEENT: Without gross lesions.  No cervical or supraclavicular adenopathy. Thyroid normal.  Lungs:  Clear without wheezing, rales or rhonchi Cardiac: RR, without RMG Abdominal:  Soft, nontender, without masses, guarding, rebound, organomegaly or hernia Breasts:  Examined lying and sitting without masses, retractions, discharge or axillary adenopathy. Pelvic:  Ext, BUS, Vagina: With atrophic changes. Pap smear of cuff done  Adnexa: Without masses or tenderness    Anus and perineum: Normal   Rectovaginal: Normal sphincter tone without palpated masses or tenderness.    Assessment/Plan:  56 y.o. G2P2 female for annual exam, status post LAVH BSO 2009 for leiomyoma and menorrhagia..   1. Postmenopausal. Having some hot flashes and sweats. Discussed reinitiating HRT, risks/benefits. Most recent studies with benefits of symptom relief, possible cardiovascular/bone health versus risks to include thrombosis such as stroke heart attack DVT and possible breast cancer issues. At this point patients unsure whether she wants to reinitiate or not. If she does choose to do so we will start with estradiol 0.5 mg orally to avoid the patches she  was having rashes with this. She will call if she decides she wants to go ahead and start this after thinking about the risks versus benefits. 2. Pap smear 2015. Pap smear done today. No history of abnormal Pap smears. Options to stop screening per current screening guidelines based on hysterectomy history discussed. Will readdress on an annual basis. 3. Colonoscopy 2016. Repeat at their recommended interval. 4. Mammography 12/2016. Continue with annual mammography when due. Breast exam normal today. SBE monthly reviewed. 5. DEXA 2014 normal. Plan repeat DEXA at age 56. Prior DEXA 2011 T score -1.4 6. Health maintenance. Borderline blood pressure noted to the patient at 132/80. She will recheck in a nonexam situation. No routine lab work done as patient does this elsewhere. Follow up 1 year, sooner as needed.   Kathy Douglas,Mahaley Schwering P MD, 11:59 AM 03/04/2017

## 2017-03-04 NOTE — Patient Instructions (Signed)
Call if you want to restart the hormone replacement therapy.  Follow up in one year for annual exam, sooner as needed

## 2017-03-05 ENCOUNTER — Encounter: Payer: TRICARE For Life (TFL) | Admitting: Gynecology

## 2017-03-05 LAB — PAP IG W/ RFLX HPV ASCU

## 2017-06-11 ENCOUNTER — Ambulatory Visit (INDEPENDENT_AMBULATORY_CARE_PROVIDER_SITE_OTHER): Admitting: Cardiovascular Disease

## 2017-06-11 ENCOUNTER — Encounter: Payer: Self-pay | Admitting: Cardiovascular Disease

## 2017-06-11 DIAGNOSIS — I341 Nonrheumatic mitral (valve) prolapse: Secondary | ICD-10-CM | POA: Diagnosis not present

## 2017-06-11 DIAGNOSIS — I10 Essential (primary) hypertension: Secondary | ICD-10-CM

## 2017-06-11 NOTE — Assessment & Plan Note (Signed)
History of mitral valve prolapse with recent echo performed 06/11/16 showing mild MVP and mild MR with normal LV function and grade 2 diastolic dysfunction. She is completely asymptomatic.

## 2017-06-11 NOTE — Assessment & Plan Note (Signed)
History of hypertension blood pressure measured initially of 130/90 and by the end of the visit on recheck 126/82. She is not on antihypertensive medications.

## 2017-06-11 NOTE — Progress Notes (Signed)
06/11/2017 Kathy Douglas   03/31/1961  [id number]  Primary Physician Beam, Chales Salmonobert K, MD Primary Cardiologist: Runell GessJonathan J Cristal Howatt MD Nicholes CalamityFACP, FACC, FAHA, [place]  HPI:  Kathy Douglas is a 56 y.o. 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 Merri Brunetteandace Smith for evaluation of murmur and mitral valve prolapse demonstrated on recent 2-D echo. I last saw her in the office 05/21/16. She has no cardiac risk factors other than family history. Father died of myocardial infarction at age 56 and her brother had an MI at age 56. She's never had a heart attack or stroke. She denies chest pain but does have mild dyspnea. She was hospitalized from 10/01/13 overnight for chest pain and underwent cardiac catheterization by Dr. SwazilandJordan revealing normal coronaries and normal left ventricular function. A 2-D echocardiogram performed 05/17/15 revealed bileaflet mitral valve prolapse, trivial mitral regurgitation and mild LV dysfunction with an EF of 45-50%. Her most recent echo performed 06/11/16 revealed normal LV systolic function with mild mitral valve prolapse and mild MR. She is completely asymptomatic.      Current Meds  Medication Sig  . Cholecalciferol (VITAMIN D3) 1000 UNITS CAPS Take 1,000 Units by mouth daily.  . Multiple Vitamins-Iron (MULTIVITAMIN/IRON PO) Take by mouth.       No Known Allergies  Social History   Socioeconomic History  . Marital status: Married    Spouse name: Not on file  . Number of children: Not on file  . Years of education: Not on file  . Highest education level: Not on file  Social Needs  . Financial resource strain: Not on file  . Food insecurity - worry: Not on file  . Food insecurity - inability: Not on file  . Transportation needs - medical: Not on file  . Transportation needs - non-medical: Not on file  Occupational History    Employer:  [employer]    Comment: Accounting  Tobacco Use  . Smoking status: Never Smoker  . Smokeless tobacco: Never Used  Substance and Sexual Activity  . Alcohol use: No    Alcohol/week: 0.0 oz  . Drug use: No  . Sexual activity: Not Currently    Comment: 1st intercourse 15 yo-5 partners  Other Topics Concern  . Not on file  Social History Narrative  . Not on file     Review of Systems: General: negative for chills, fever, night sweats or weight changes.  Cardiovascular: negative for chest pain, dyspnea on exertion, edema, orthopnea, palpitations, paroxysmal nocturnal dyspnea or shortness of breath Dermatological: negative for rash Respiratory: negative for cough or wheezing Urologic: negative for hematuria Abdominal: negative for nausea, vomiting, diarrhea, bright red blood per rectum, melena, or hematemesis Neurologic: negative for visual changes, syncope, or dizziness All other systems reviewed and are otherwise negative except as noted above.    Blood pressure 126/82, pulse 96, height 5\' 7"  (1.702 m), weight 129 lb 12.8 oz (58.9 kg).  General appearance: alert and no distress Neck: no adenopathy, no carotid bruit, no JVD, supple, symmetrical, trachea midline and thyroid not enlarged, symmetric, no tenderness/mass/nodules Lungs: clear to auscultation bilaterally Heart: mid systolic click present Extremities: extremities normal, atraumatic, no cyanosis or edema Pulses: 2+ and symmetric Skin: Skin color, texture, turgor normal. No rashes or lesions Neurologic: Alert and oriented X 3, normal strength and tone. Normal symmetric reflexes. Normal coordination  and gait  EKG sinus rhythm at 96 with nonspecific ST and T-wave changes. I personally reviewed this EKG  ASSESSMENT AND PLAN:   Mitral valve prolapse History of mitral valve prolapse with recent echo performed 06/11/16 showing mild MVP and mild MR with normal LV function and grade 2 diastolic dysfunction. She is  completely asymptomatic.  Essential hypertension History of hypertension blood pressure measured initially of 130/90 and by the end of the visit on recheck 126/82. She is not on antihypertensive medications.      Runell GessJonathan J. Telissa Palmisano MD FACP,FACC,FAHA, [facility] 06/11/2017 11:21 AM

## 2017-06-11 NOTE — Addendum Note (Signed)
Addended by: Chana BodeGREEN, Arul Farabee L on: 06/11/2017 04:29 PM   Modules accepted: Orders

## 2017-06-11 NOTE — Patient Instructions (Signed)
Medication Instructions:  Continue current medications  If you need a refill on your cardiac medications before your next appointment, please call your pharmacy.  Labwork: None Ordered   Testing/Procedures: None Ordered  Follow-Up: Your physician wants you to follow-up in: 1 Year. You should receive a reminder letter in the mail two months in advance. If you do not receive a letter, please call our office [phone].    Thank you for choosing [facility]!!      

## 2018-01-19 ENCOUNTER — Other Ambulatory Visit: Payer: Self-pay | Admitting: Gynecology

## 2018-01-19 DIAGNOSIS — Z1231 Encounter for screening mammogram for malignant neoplasm of breast: Secondary | ICD-10-CM

## 2018-01-20 ENCOUNTER — Ambulatory Visit
Admission: RE | Admit: 2018-01-20 | Discharge: 2018-01-20 | Disposition: A | Discharge status: Home / Self Care | Source: Ambulatory Visit | Attending: Gynecology | Admitting: Gynecology

## 2018-01-20 DIAGNOSIS — Z1231 Encounter for screening mammogram for malignant neoplasm of breast: Secondary | ICD-10-CM

## 2018-03-19 ENCOUNTER — Encounter: Payer: TRICARE For Life (TFL) | Admitting: Gynecology

## 2018-04-06 ENCOUNTER — Ambulatory Visit (INDEPENDENT_AMBULATORY_CARE_PROVIDER_SITE_OTHER): Admitting: Gynecology

## 2018-04-06 ENCOUNTER — Encounter: Payer: Self-pay | Admitting: Gynecology

## 2018-04-06 VITALS — BP 130/88 | Ht 67.0 in | Wt 124.0 lb

## 2018-04-06 DIAGNOSIS — N952 Postmenopausal atrophic vaginitis: Secondary | ICD-10-CM | POA: Diagnosis not present

## 2018-04-06 DIAGNOSIS — Z01419 Encounter for gynecological examination (general) (routine) without abnormal findings: Secondary | ICD-10-CM | POA: Diagnosis not present

## 2018-04-06 DIAGNOSIS — N951 Menopausal and female climacteric states: Secondary | ICD-10-CM | POA: Diagnosis not present

## 2018-04-06 MED ORDER — ESTRADIOL 0.5 MG PO TABS: 0.5000 mg | ORAL_TABLET | Freq: Every day | ORAL | 4 refills | Status: DC

## 2018-04-06 NOTE — Progress Notes (Signed)
    Kathy Douglas 08/21/1960 [id number]        57 y.o.  G2P2 for annual gynecologic exam.  Continues to have significant hot flushes and sweats as well as some brain fog and tiredness overall.  Had been on HRT with a history of LAVH BSO 2009.  Discontinued last year due to skin irritation with the patches.  Had talked about reinitiating HRT last year with estradiol tablets but she never did this.  Past medical history,surgical history, problem list, medications, allergies, family history and social history were all reviewed and documented as reviewed in the EPIC chart.  ROS:  Performed with pertinent positives and negatives included in the history, assessment and plan.   Additional significant findings : None   Exam: Biomedical scientist Vitals:   04/06/18 1616  BP: 130/88  Weight: 124 lb (56.2 kg)  Height: 5\' 7"  (1.702 m)   Body mass index is 19.42 kg/m.  General appearance:  Normal affect, orientation and appearance. Skin: Grossly normal HEENT: Without gross lesions.  No cervical or supraclavicular adenopathy. Thyroid normal.  Lungs:  Clear without wheezing, rales or rhonchi Cardiac: RR, without RMG Abdominal:  Soft, nontender, without masses, guarding, rebound, organomegaly or hernia Breasts:  Examined lying and sitting without masses, retractions, discharge or axillary adenopathy. Pelvic:  Ext, BUS, Vagina: With atrophic changes  Adnexa: Without masses or tenderness    Anus and perineum: Normal   Rectovaginal: Normal sphincter tone without palpated masses or tenderness.    Assessment/Plan:  57 y.o. G2P2 female for annual gynecologic exam.   1. Postmenopausal/menopausal symptoms.  We again discussed her menopausal symptoms and options for treatment.  Reinitiation of HRT was reviewed with risks versus benefits discussed to include thrombosis such as stroke heart attack DVT in the breast cancer issue.  Benefits as far as symptom relief and possible cardiovascular and bone health.   At this point the patient is interested in reinitiation.  Trans-dermal/vaginal versus oral replacement discussed.  First pass effect reviewed.  At this point the patient will go ahead and start on estradiol 0.5 mg daily.  She will call me if she has any issues or questions.  Otherwise she will continue on this for now with 1 year refill provided. 2. Mammography 2019.  Continue with annual mammography next year when due.  Breast exam normal today. 3. Pap smear 2018.  No Pap smear done today.  No history of abnormal Pap smears previously.  Options to stop screening per current screening guidelines based on age reviewed.  Will readdress on an annual basis. 4. DEXA 2014 normal.  Plan repeat DEXA at age 57. 5. Colonoscopy 2016.  Repeat at their recommended interval. 6. Health maintenance.  No routine lab work done as patient reports this done elsewhere.  Blood pressure noted at 133/88.  Patient is on antihypertensive.  Will monitor in a non-exam situation and follow-up with her primary physician if continues elevated.  Follow-up in 1 year, sooner as needed.   Dara Lords MD, 4:38 PM 04/06/2018

## 2018-04-06 NOTE — Patient Instructions (Signed)
Start on the estrogen replacement as we discussed.  Call if you have any issues with this.

## 2018-07-15 ENCOUNTER — Ambulatory Visit (INDEPENDENT_AMBULATORY_CARE_PROVIDER_SITE_OTHER): Admitting: Cardiovascular Disease

## 2018-07-15 ENCOUNTER — Encounter: Payer: Self-pay | Admitting: Cardiovascular Disease

## 2018-07-15 DIAGNOSIS — I341 Nonrheumatic mitral (valve) prolapse: Secondary | ICD-10-CM | POA: Diagnosis not present

## 2018-07-15 DIAGNOSIS — I10 Essential (primary) hypertension: Secondary | ICD-10-CM

## 2018-07-15 NOTE — Progress Notes (Signed)
07/15/2018 Kathy Douglas   [birth year]  [id number]  Primary Physician Jordan Hawks, PA-C Primary Cardiologist: Runell Gess MD Nicholes Calamity, [place]  HPI:  Kathy Douglas is a 57 y.o.  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 Merri Brunette for evaluation of murmur and mitral valve prolapse demonstrated on recent 2-D echo. I last saw her in the office  06/11/2017. She has no cardiac risk factors other than family history. Father died of myocardial infarction at age 102 and her brother had an MI at age 43. She's never had a heart attack or stroke. She denies chest pain but does have mild dyspnea. She was hospitalized from 10/01/13 overnight for chest pain and underwent cardiac catheterization by Dr. [place] revealing normal coronaries and normal left ventricular function. A 2-D echocardiogram performed10/19/16 revealed bileaflet mitral valve prolapse, trivial mitral regurgitation and mild LV dysfunction with an EF of 45-50%. Her most recent echo performed 06/11/16 revealed normal LV systolic function with mild mitral valve prolapse and mild MR.   Since I saw her in the office a year ago she is remained completely asymptomatic.  She denies chest pain or shortness of breath.   Current Meds  Medication Sig  . amLODipine (NORVASC) 5 MG tablet Take 5 mg by mouth daily.  . Cholecalciferol (VITAMIN D3) 1000 UNITS CAPS Take 1,000 Units by mouth daily.  Marland Kitchen estradiol (ESTRACE) 0.5 MG tablet Take 1 tablet (0.5 mg total) by mouth daily.  . Multiple Vitamins-Iron (MULTIVITAMIN/IRON PO) Take by mouth.       No Known Allergies  Social History   Socioeconomic History  . Marital status: Married    Spouse name: Not on file  . Number of children: Not on file  . Years of education: Not on file  . Highest education level: Not on file  Occupational History    Employer:  [employer]    Comment: Accounting  Social Needs  . Financial resource strain: Not on file  . Food insecurity:    Worry: Not on file    Inability: Not on file  . Transportation needs:    Medical: Not on file    Non-medical: Not on file  Tobacco Use  . Smoking status: Never Smoker  . Smokeless tobacco: Never Used  Substance and Sexual Activity  . Alcohol use: No    Alcohol/week: 0.0 standard drinks  . Drug use: No  . Sexual activity: Not Currently    Comment: 1st intercourse 15 yo-5 partners  Lifestyle  . Physical activity:    Days per week: Not on file    Minutes per session: Not on file  . Stress: Not on file  Relationships  . Social connections:    Talks on phone: Not on file    Gets together: Not on file    Attends religious service: Not on file    Active member of club or organization: Not on file    Attends meetings of clubs or organizations: Not on file    Relationship status: Not on file  . Intimate partner violence:    Fear of current or ex partner: Not on file    Emotionally abused: Not on file    Physically abused: Not on file    Forced sexual activity: Not on file  Other Topics Concern  . Not on file  Social History  Narrative  . Not on file     Review of Systems: General: negative for chills, fever, night sweats or weight changes.  Cardiovascular: negative for chest pain, dyspnea on exertion, edema, orthopnea, palpitations, paroxysmal nocturnal dyspnea or shortness of breath Dermatological: negative for rash Respiratory: negative for cough or wheezing Urologic: negative for hematuria Abdominal: negative for nausea, vomiting, diarrhea, bright red blood per rectum, melena, or hematemesis Neurologic: negative for visual changes, syncope, or dizziness All other systems reviewed and are otherwise negative except as noted above.    Blood pressure 130/86, pulse 75, height 5\' 7"  (1.702 m), weight 127 lb (57.6 kg).  General appearance: alert and no  distress Neck: no adenopathy, no carotid bruit, no JVD, supple, symmetrical, trachea midline and thyroid not enlarged, symmetric, no tenderness/mass/nodules Lungs: clear to auscultation bilaterally Heart: 2/6 apical systolic ejection murmur most likely related to mitral regurgitation. Extremities: extremities normal, atraumatic, no cyanosis or edema Pulses: 2+ and symmetric Skin: Skin color, texture, turgor normal. No rashes or lesions Neurologic: Alert and oriented X 3, normal strength and tone. Normal symmetric reflexes. Normal coordination and gait  EKG sinus rhythm at 75 without ST or T wave changes. I  Personally reviewed this EKG.  ASSESSMENT AND PLAN:   Mitral valve prolapse History of mitral valve prolapse with echo performed bileaflet pro prolapse with mild MR.  She does have a loud murmur that I recall.  I will repeat a 2D echocardiogram.  Essential hypertension History of essential hypertension blood pressure measured today 130/86.  She is on amlodipine.      Runell GessJonathan J. Yenni Carra MD FACP,FACC,FAHA, [facility] 07/15/2018 3:07 PM

## 2018-07-15 NOTE — Assessment & Plan Note (Signed)
History of mitral valve prolapse with echo performed bileaflet pro prolapse with mild MR.  She does have a loud murmur that I recall.  I will repeat a 2D echocardiogram.

## 2018-07-15 NOTE — Patient Instructions (Signed)
Medication Instructions:  Your physician recommends that you continue on your current medications as directed. Please refer to the Current Medication list given to you today.  If you need a refill on your cardiac medications before your next appointment, please call your pharmacy.   Lab work: Your physician recommends that you return for lab work: LIPID/ LIVER  If you have labs (blood work) drawn today and your tests are completely normal, you will receive your results only by: Marland Kitchen. MyChart Message (if you have MyChart) OR . A paper copy in the mail If you have any lab test that is abnormal or we need to change your treatment, we will call you to review the results.  Testing/Procedures: Your physician has requested that you have an echocardiogram. Echocardiography is a painless test that uses sound waves to create images of your heart. It provides your doctor with information about the size and shape of your heart and how well your heart's chambers and valves are working. This procedure takes approximately one hour. There are no restrictions for this procedure.    Follow-Up: At [facility], you and your health needs are our priority.  As part of our continuing mission to provide you with exceptional heart care, we have created designated Provider Care Teams.  These Care Teams include your primary Cardiologist (physician) and Advanced Practice Providers (APPs -  Physician Assistants and Nurse Practitioners) who all work together to provide you with the care you need, when you need it. You will need a follow up appointment in 12 months.  Please call our office 2 months in advance to schedule this appointment.  You may see DR. BERRY or one of the following Advanced Practice Providers on your designated Care Team:   Corine ShelterLuke Kilroy, PA-C [place]a [place], [place] . Marjie Skiffallie Goodrich, PA-C

## 2018-07-15 NOTE — Assessment & Plan Note (Signed)
History of essential hypertension blood pressure measured today 130/86.  She is on amlodipine.

## 2018-07-30 ENCOUNTER — Ambulatory Visit (HOSPITAL_COMMUNITY): Attending: Cardiovascular Disease

## 2018-07-30 ENCOUNTER — Other Ambulatory Visit: Payer: Self-pay

## 2018-07-30 DIAGNOSIS — I341 Nonrheumatic mitral (valve) prolapse: Secondary | ICD-10-CM

## 2018-08-24 LAB — HEPATIC FUNCTION PANEL
ALBUMIN: 4.3 g/dL (ref 3.8–4.9)
ALK PHOS: 52 IU/L (ref 39–117)
ALT: 8 IU/L (ref 0–32)
AST: 14 IU/L (ref 0–40)
BILIRUBIN TOTAL: 0.3 mg/dL (ref 0.0–1.2)
BILIRUBIN, DIRECT: 0.1 mg/dL (ref 0.00–0.40)
TOTAL PROTEIN: 6.6 g/dL (ref 6.0–8.5)

## 2018-08-24 LAB — LIPID PANEL
Chol/HDL Ratio: 1.8 ratio (ref 0.0–4.4)
Cholesterol, Total: 166 mg/dL (ref 100–199)
HDL: 90 mg/dL (ref >39)
LDL CALC: 67 mg/dL (ref 0–99)
Triglycerides: 43 mg/dL (ref 0–149)
VLDL CHOLESTEROL CAL: 9 mg/dL (ref 5–40)

## 2019-02-25 ENCOUNTER — Other Ambulatory Visit: Payer: Self-pay | Admitting: Gynecology

## 2019-02-25 ENCOUNTER — Other Ambulatory Visit: Payer: Self-pay | Admitting: Obstetrics and Gynecology

## 2019-02-25 DIAGNOSIS — Z1231 Encounter for screening mammogram for malignant neoplasm of breast: Secondary | ICD-10-CM

## 2019-04-08 ENCOUNTER — Encounter: Admitting: Gynecology

## 2019-04-09 ENCOUNTER — Ambulatory Visit

## 2019-04-27 ENCOUNTER — Encounter: Payer: Self-pay | Admitting: Gynecology

## 2019-07-21 ENCOUNTER — Ambulatory Visit
Admission: RE | Admit: 2019-07-21 | Discharge: 2019-07-21 | Disposition: A | Discharge status: Home / Self Care | Source: Ambulatory Visit | Attending: Gynecology | Admitting: Gynecology

## 2019-07-21 ENCOUNTER — Other Ambulatory Visit: Payer: Self-pay

## 2019-07-21 DIAGNOSIS — Z1231 Encounter for screening mammogram for malignant neoplasm of breast: Secondary | ICD-10-CM

## 2019-12-09 ENCOUNTER — Encounter: Payer: Self-pay | Admitting: General Practice

## 2019-12-28 ENCOUNTER — Encounter: Payer: Self-pay | Admitting: Cardiovascular Disease

## 2019-12-28 ENCOUNTER — Ambulatory Visit (INDEPENDENT_AMBULATORY_CARE_PROVIDER_SITE_OTHER): Admitting: Cardiovascular Disease

## 2019-12-28 ENCOUNTER — Other Ambulatory Visit: Payer: Self-pay

## 2019-12-28 VITALS — BP 128/88 | HR 79 | Ht 67.0 in | Wt 127.6 lb

## 2019-12-28 DIAGNOSIS — I10 Essential (primary) hypertension: Secondary | ICD-10-CM

## 2019-12-28 DIAGNOSIS — I341 Nonrheumatic mitral (valve) prolapse: Secondary | ICD-10-CM

## 2019-12-28 NOTE — Assessment & Plan Note (Signed)
History of essential hypertension blood pressure measured today 162/80.  She is on amlodipine which she has taken this morning.  She says she did not get much sleep last night however.

## 2019-12-28 NOTE — Patient Instructions (Signed)
Medication Instructions:  Your physician recommends that you continue on your current medications as directed. Please refer to the Current Medication list given to you today.  *If you need a refill on your cardiac medications before your next appointment, please call your pharmacy*   Testing/Procedures: Your physician has requested that you have an echocardiogram. Echocardiography is a painless test that uses sound waves to create images of your heart. It provides your doctor with information about the size and shape of your heart and how well your heart's chambers and valves are working. This procedure takes approximately one hour. There are no restrictions for this procedure. -- [address]   Follow-Up: At [facility], you and your health needs are our priority.  As part of our continuing mission to provide you with exceptional heart care, we have created designated Provider Care Teams.  These Care Teams include your primary Cardiologist (physician) and Advanced Practice Providers (APPs -  Physician Assistants and Nurse Practitioners) who all work together to provide you with the care you need, when you need it.  We recommend signing up for the patient portal called "MyChart".  Sign up information is provided on this After Visit Summary.  MyChart is used to connect with patients for Virtual Visits (Telemedicine).  Patients are able to view lab/test results, encounter notes, upcoming appointments, etc.  Non-urgent messages can be sent to your provider as well.   To learn more about what you can do with MyChart, go to [email or web address].    Your next appointment:   12 month(s)  The format for your next appointment:   In Person  Provider:   You may see Dr. Allyson Sabal or one of the following Advanced Practice Providers on your designated Care Team:    Corine Shelter, PA-C  [place], [place]  Edd Fabian, [place]    Other Instructions

## 2019-12-28 NOTE — Assessment & Plan Note (Signed)
History of bileaflet prolapse with mild to moderate MR by 2D echo performed 07/30/2018.  She has a clear midsystolic click on exam.  We will recheck a 2D echocardiogram.

## 2019-12-28 NOTE — Progress Notes (Signed)
12/28/2019 TIFFNAY BOSSI   1960/11/26  [id number]  Primary Physician Mindi Curling, PA-C Primary Cardiologist: Lorretta Harp MD Lupe Carney, [place]  HPI:  Kathy Douglas is a 59 y.o.   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 Carol Ada for evaluation of murmur and mitral valve prolapse demonstrated on recent 2-D echo. I last saw her in the office  07/15/2018. She has no cardiac risk factors other than family history. Father died of myocardial infarction at age 1 and her brother had an MI at age 7. She's never had a heart attack or stroke. She denies chest pain but does have mild dyspnea. She was hospitalized from 10/01/13 overnight for chest pain and underwent cardiac catheterization by Dr. Martin[place] revealing normal coronaries and normal left ventricular function. A 2-D echocardiogram performed10/19/16 revealed bileaflet mitral valve prolapse, trivial mitral regurgitation and mild LV dysfunction with an EF of 45-50%.Her most recent echo performed 06/11/16 revealed normal LV systolic function with mild mitral valve prolapse and mild MR.  Since I saw her in the office a year and a half ago she is remained stable.  She denies chest pain or shortness of breath.  Her last 2D echo performed 07/31/2018 revealed normal LV size and function with bileaflet prolapse and mild to moderate MR.   Current Meds  Medication Sig  . amLODipine (NORVASC) 5 MG tablet Take 5 mg by mouth daily.  . Cholecalciferol (VITAMIN D3) 1000 UNITS CAPS Take 1,000 Units by mouth daily.  . Multiple Vitamins-Iron (MULTIVITAMIN/IRON PO) Take by mouth.    . [DISCONTINUED] ergocalciferol (VITAMIN D2) 1.25 MG (50000 UT) capsule Take by mouth.     No Known Allergies  Social History   Socioeconomic History  . Marital status: Married    Spouse name: Not on file  . Number of children: Not  on file  . Years of education: Not on file  . Highest education level: Not on file  Occupational History    Employer: [employer]    Comment: Accounting  Tobacco Use  . Smoking status: Never Smoker  . Smokeless tobacco: Never Used  Substance and Sexual Activity  . Alcohol use: No    Alcohol/week: 0.0 standard drinks  . Drug use: No  . Sexual activity: Not Currently    Comment: 1st intercourse 15 yo-5 partners  Other Topics Concern  . Not on file  Social History Narrative  . Not on file   Social Determinants of Health   Financial Resource Strain:   . Difficulty of Paying Living Expenses:   Food Insecurity:   . Worried About Charity fundraiser in the Last Year:   . Arboriculturist in the Last Year:   Transportation Needs:   . Film/video editor (Medical):   Marland Kitchen Lack of Transportation (Non-Medical):   Physical Activity:   . Days of Exercise per Week:   . Minutes of Exercise per Session:   Stress:   . Feeling of Stress :   Social Connections:   . Frequency of Communication with Friends and Family:   . Frequency of Social Gatherings with Friends and Family:   . Attends Religious Services:   . Active Member of Clubs or Organizations:   . Attends Archivist Meetings:   Marland Kitchen Marital Status:   Intimate Partner Violence:   . Fear of  Current or Ex-Partner:   . Emotionally Abused:   Marland Kitchen Physically Abused:   . Sexually Abused:      Review of Systems: General: negative for chills, fever, night sweats or weight changes.  Cardiovascular: negative for chest pain, dyspnea on exertion, edema, orthopnea, palpitations, paroxysmal nocturnal dyspnea or shortness of breath Dermatological: negative for rash Respiratory: negative for cough or wheezing Urologic: negative for hematuria Abdominal: negative for nausea, vomiting, diarrhea, bright red blood per rectum, melena, or hematemesis Neurologic: negative for visual changes, syncope, or dizziness All other systems  reviewed and are otherwise negative except as noted above.    Blood pressure (!) 162/80, pulse 79, height 5\' 7"  (1.702 m), weight 127 lb 9.6 oz (57.9 kg), SpO2 98 %.  General appearance: alert and no distress Neck: no adenopathy, no carotid bruit, no JVD, supple, symmetrical, trachea midline and thyroid not enlarged, symmetric, no tenderness/mass/nodules Lungs: clear to auscultation bilaterally Heart: Midsystolic click consistent with mitral valve prolapse Extremities: extremities normal, atraumatic, no cyanosis or edema Pulses: 2+ and symmetric Skin: Skin color, texture, turgor normal. No rashes or lesions Neurologic: Alert and oriented X 3, normal strength and tone. Normal symmetric reflexes. Normal coordination and gait  EKG sinus rhythm at 79 with frequent PVCs and PACs.  I personally reviewed this EKG.  ASSESSMENT AND PLAN:   Essential hypertension History of essential hypertension blood pressure measured today 162/80.  She is on amlodipine which she has taken this morning.  She says she did not get much sleep last night however.  Mitral valve prolapse History of bileaflet prolapse with mild to moderate MR by 2D echo performed 07/30/2018.  She has a clear midsystolic click on exam.  We will recheck a 2D echocardiogram.      09/28/2018 MD [facility], [facility] 12/28/2019 9:20 AM

## 2019-12-30 ENCOUNTER — Ambulatory Visit: Attending: Internal Medicine

## 2019-12-30 DIAGNOSIS — Z23 Encounter for immunization: Secondary | ICD-10-CM

## 2019-12-30 NOTE — Progress Notes (Signed)
° °  Covid-19 Vaccination Clinic  Name:  Kathy Douglas    MRN: [id number] DOB: 05-Feb-1961  12/30/2019  Ms. Mikowski was observed post Covid-19 immunization for 15 minutes without incident. She was provided with Vaccine Information Sheet and instruction to access the V-Safe system.   Ms. Cogliano was instructed to call 911 with any severe reactions post vaccine:  Difficulty breathing   Swelling of face and throat   A fast heartbeat   A bad rash all over body   Dizziness and weakness   Immunizations Administered    Name Date Dose VIS Date Route   Pfizer COVID-19 Vaccine 12/30/2019  2:58 PM 0.3 mL 09/22/2018 Intramuscular   Manufacturer: [employer], [employer]   Lot: [id number]   NDC: [address]-6

## 2020-01-14 ENCOUNTER — Ambulatory Visit (HOSPITAL_COMMUNITY): Attending: Cardiology

## 2020-01-14 ENCOUNTER — Other Ambulatory Visit: Payer: Self-pay

## 2020-01-14 DIAGNOSIS — I341 Nonrheumatic mitral (valve) prolapse: Secondary | ICD-10-CM | POA: Diagnosis not present

## 2020-01-17 ENCOUNTER — Telehealth: Payer: Self-pay | Admitting: Cardiovascular Disease

## 2020-01-17 NOTE — Telephone Encounter (Signed)
Pt updated with ECHO results and verbalized understanding.  

## 2020-01-17 NOTE — Telephone Encounter (Signed)
Patient returning call from 01/14/20 in regards to echo results.

## 2020-01-20 ENCOUNTER — Ambulatory Visit: Attending: Internal Medicine

## 2020-01-20 DIAGNOSIS — Z23 Encounter for immunization: Secondary | ICD-10-CM

## 2020-01-20 NOTE — Progress Notes (Signed)
   Covid-19 Vaccination Clinic  Name:  ALANIE SYLER    MRN: [id number] DOB: 02/19/1961  01/20/2020  Ms. Pak was observed post Covid-19 immunization for 15 minutes without incident. She was provided with Vaccine Information Sheet and instruction to access the V-Safe system.   Ms. Hayne was instructed to call 911 with any severe reactions post vaccine: Marland Kitchen Difficulty breathing  . Swelling of face and throat  . A fast heartbeat  . A bad rash all over body  . Dizziness and weakness   Immunizations Administered    Name Date Dose VIS Date Route   Pfizer COVID-19 Vaccine 01/20/2020 10:26 AM 0.3 mL 09/22/2018 Intramuscular   Manufacturer: [employer], [employer]   Lot: [id number]   NDC: [address]-4

## 2020-06-27 ENCOUNTER — Other Ambulatory Visit: Payer: Self-pay | Admitting: Physician Assistant

## 2020-06-27 DIAGNOSIS — Z1231 Encounter for screening mammogram for malignant neoplasm of breast: Secondary | ICD-10-CM

## 2020-08-08 ENCOUNTER — Ambulatory Visit

## 2020-10-11 ENCOUNTER — Emergency Department (HOSPITAL_BASED_OUTPATIENT_CLINIC_OR_DEPARTMENT_OTHER)

## 2020-10-11 ENCOUNTER — Emergency Department (HOSPITAL_COMMUNITY)

## 2020-10-11 ENCOUNTER — Encounter (HOSPITAL_BASED_OUTPATIENT_CLINIC_OR_DEPARTMENT_OTHER): Payer: Self-pay | Admitting: Emergency Medicine

## 2020-10-11 ENCOUNTER — Emergency Department (HOSPITAL_BASED_OUTPATIENT_CLINIC_OR_DEPARTMENT_OTHER): Admitting: Radiology

## 2020-10-11 ENCOUNTER — Emergency Department (HOSPITAL_BASED_OUTPATIENT_CLINIC_OR_DEPARTMENT_OTHER)
Admission: EM | Admit: 2020-10-11 | Discharge: 2020-10-11 | Disposition: A | Discharge status: Home / Self Care | Attending: Emergency Medicine | Admitting: Emergency Medicine

## 2020-10-11 ENCOUNTER — Other Ambulatory Visit: Payer: Self-pay

## 2020-10-11 DIAGNOSIS — R531 Weakness: Secondary | ICD-10-CM | POA: Diagnosis not present

## 2020-10-11 DIAGNOSIS — R42 Dizziness and giddiness: Secondary | ICD-10-CM

## 2020-10-11 DIAGNOSIS — R519 Headache, unspecified: Secondary | ICD-10-CM | POA: Insufficient documentation

## 2020-10-11 DIAGNOSIS — Z79899 Other long term (current) drug therapy: Secondary | ICD-10-CM | POA: Diagnosis not present

## 2020-10-11 DIAGNOSIS — I10 Essential (primary) hypertension: Secondary | ICD-10-CM | POA: Insufficient documentation

## 2020-10-11 DIAGNOSIS — R110 Nausea: Secondary | ICD-10-CM | POA: Diagnosis not present

## 2020-10-11 DIAGNOSIS — R059 Cough, unspecified: Secondary | ICD-10-CM | POA: Diagnosis not present

## 2020-10-11 HISTORY — DX: Essential (primary) hypertension: I10

## 2020-10-11 LAB — CBC WITH DIFFERENTIAL/PLATELET
Abs Immature Granulocytes: 0.01 10*3/uL (ref 0.00–0.07)
Basophils Absolute: 0 10*3/uL (ref 0.0–0.1)
Basophils Relative: 0 %
Eosinophils Absolute: 0 10*3/uL (ref 0.0–0.5)
Eosinophils Relative: 0 %
HCT: 44.8 % (ref 36.0–46.0)
Hemoglobin: 14.4 g/dL (ref 12.0–15.0)
Immature Granulocytes: 0 %
Lymphocytes Relative: 13 %
Lymphs Abs: 0.6 10*3/uL — ABNORMAL LOW (ref 0.7–4.0)
MCH: 29.8 pg (ref 26.0–34.0)
MCHC: 32.1 g/dL (ref 30.0–36.0)
MCV: 92.8 fL (ref 80.0–100.0)
Monocytes Absolute: 0.1 10*3/uL (ref 0.1–1.0)
Monocytes Relative: 2 %
Neutro Abs: 4.2 10*3/uL (ref 1.7–7.7)
Neutrophils Relative %: 85 %
Platelets: 173 10*3/uL (ref 150–400)
RBC: 4.83 MIL/uL (ref 3.87–5.11)
RDW: 12.5 % (ref 11.5–15.5)
WBC: 5 10*3/uL (ref 4.0–10.5)
nRBC: 0 % (ref 0.0–0.2)

## 2020-10-11 LAB — URINALYSIS, ROUTINE W REFLEX MICROSCOPIC
Bilirubin Urine: NEGATIVE
Glucose, UA: NEGATIVE mg/dL
Hgb urine dipstick: NEGATIVE
Ketones, ur: NEGATIVE mg/dL
Leukocytes,Ua: NEGATIVE
Nitrite: NEGATIVE
Protein, ur: NEGATIVE mg/dL
Specific Gravity, Urine: 1.004 — ABNORMAL LOW (ref 1.005–1.030)
pH: 8 (ref 5.0–8.0)

## 2020-10-11 LAB — COMPREHENSIVE METABOLIC PANEL
ALT: 20 U/L (ref 0–44)
AST: 23 U/L (ref 15–41)
Albumin: 4.9 g/dL (ref 3.5–5.0)
Alkaline Phosphatase: 42 U/L (ref 38–126)
Anion gap: 10 (ref 5–15)
BUN: 14 mg/dL (ref 6–20)
CO2: 27 mmol/L (ref 22–32)
Calcium: 10.2 mg/dL (ref 8.9–10.3)
Chloride: 103 mmol/L (ref 98–111)
Creatinine, Ser: 1.02 mg/dL — ABNORMAL HIGH (ref 0.44–1.00)
GFR, Estimated: >60 mL/min (ref >60)
Glucose, Bld: 100 mg/dL — ABNORMAL HIGH (ref 70–99)
Potassium: 3.8 mmol/L (ref 3.5–5.1)
Sodium: 140 mmol/L (ref 135–145)
Total Bilirubin: 0.5 mg/dL (ref 0.3–1.2)
Total Protein: 7.5 g/dL (ref 6.5–8.1)

## 2020-10-11 LAB — MAGNESIUM: Magnesium: 2 mg/dL (ref 1.7–2.4)

## 2020-10-11 LAB — LIPASE, BLOOD: Lipase: 17 U/L (ref 11–51)

## 2020-10-11 LAB — TROPONIN I (HIGH SENSITIVITY): Troponin I (High Sensitivity): 3 ng/L (ref <18)

## 2020-10-11 MED ORDER — MECLIZINE HCL 25 MG PO TABS
25.0000 mg | ORAL_TABLET | Freq: Once | ORAL | Status: AC
  Administered 2020-10-11: 25 mg via ORAL
  Filled 2020-10-11: qty 1

## 2020-10-11 MED ORDER — MECLIZINE HCL 25 MG PO TABS: 25.0000 mg | ORAL_TABLET | Freq: Three times a day (TID) | ORAL | 0 refills | Status: AC | PRN

## 2020-10-11 MED ORDER — ONDANSETRON HCL 4 MG/2ML IJ SOLN
4.0000 mg | Freq: Once | INTRAMUSCULAR | Status: DC
  Filled 2020-10-11: qty 2

## 2020-10-11 MED ORDER — IOHEXOL 350 MG/ML SOLN
75.0000 mL | Freq: Once | INTRAVENOUS | Status: AC | PRN
  Administered 2020-10-11: 75 mL via INTRAVENOUS

## 2020-10-11 MED ORDER — SODIUM CHLORIDE 0.9 % IV BOLUS
1000.0000 mL | Freq: Once | INTRAVENOUS | Status: AC
  Administered 2020-10-11: 1000 mL via INTRAVENOUS

## 2020-10-11 NOTE — ED Provider Notes (Signed)
Patient signed out to me at 3 PM.  Awaiting CTA of head and neck.  Having dizziness, gait imbalance since this morning.  No weakness or numbness or speech changes or vision changes.  Denies any chest pain, shortness of breath.  Feels fairly asymptomatic at rest but feels unsteady and dizzy when walking.  She does not appear steady when she walks here.  She has normal finger-to-nose finger but slow heel-to-shin bilaterally.  Lab work has thus far been unremarkable.  No significant anemia, electrolyte abnormality, kidney injury.  EKG shows sinus rhythm.  Troponin normal.  Suspect that she will need MRI to rule out stroke.  Awaiting CTA head and neck and likely will transfer to [facility] for MRI to fully rule out stroke.  Could be a peripheral vertigo.  Given meclizine.  CTA is unremarkable.  Talked with Dr. Silverio Lay at [facility] who accepts the patient in transfer for MRI to rule out stroke.  Neurology has not been consulted.  Suspect conversation with neurology may be warranted after MRI but suspect that if MRI does not show stroke she is likely safe for discharge and treatment for peripheral vertigo.  This chart was dictated using voice recognition software.  Despite best efforts to proofread,  errors can occur which can change the documentation meaning.     Virgina Norfolk, DO 10/11/20 1555

## 2020-10-11 NOTE — ED Provider Notes (Signed)
Care assumed from Dr. Charm Barges and Dr. Lockie Mola after patient was transferred from [facility], presents with lightheadedness and dizziness had reassuring lab work and normal CTAs of the head and neck but symptoms had not completely resolved so she was sent for MRI to rule out stroke as cause for dizziness.  Her dizziness was treated with fluids and meclizine and has continued to improve.  Plan: Follow-up MRI, if any abnormalities will need neurology consult, but if negative can likely go home with outpatient follow-up.  BP 112/72 (BP Location: Right Arm)   Pulse 83   Temp 97.8 F (36.6 C) (Oral)   Resp 16   Ht 5\' 7"  (1.702 m)   Wt 56.7 kg   SpO2 100%   BMI 19.58 kg/m    ED Course/Procedures   Labs Reviewed  COMPREHENSIVE METABOLIC PANEL - Abnormal; Notable for the following components:      Result Value   Glucose, Bld 100 (*)    Creatinine, Ser 1.02 (*)    All other components within normal limits  CBC WITH DIFFERENTIAL/PLATELET - Abnormal; Notable for the following components:   Lymphs Abs 0.6 (*)    All other components within normal limits  URINALYSIS, ROUTINE W REFLEX MICROSCOPIC - Abnormal; Notable for the following components:   Color, Urine COLORLESS (*)    Specific Gravity, Urine 1.004 (*)    All other components within normal limits  LIPASE, BLOOD  MAGNESIUM  TROPONIN I (HIGH SENSITIVITY)   CT Angio Head W or Wo Contrast  Result Date: 10/11/2020 CLINICAL DATA:  Dizziness, persistent/recurrent, cardiac or vascular cause suspected. EXAM: CT ANGIOGRAPHY HEAD AND NECK TECHNIQUE: Multidetector CT imaging of the head and neck was performed using the standard protocol during bolus administration of intravenous contrast. Multiplanar CT image reconstructions and MIPs were obtained to evaluate the vascular anatomy. Carotid stenosis measurements (when applicable) are obtained utilizing NASCET criteria, using the distal internal carotid diameter as the denominator.  CONTRAST:  21mL OMNIPAQUE IOHEXOL 350 MG/ML SOLN COMPARISON:  None. FINDINGS: CT HEAD FINDINGS Brain: No evidence of acute infarction, hemorrhage, hydrocephalus, extra-axial collection or mass lesion/mass effect. Vascular: No hyperdense vessel or unexpected calcification. Skull: Normal. Negative for fracture or focal lesion. Sinuses: Imaged portions are clear. Orbits: No acute finding. Review of the MIP images confirms the above findings CTA NECK FINDINGS Aortic arch: Standard branching. Imaged portion shows no evidence of aneurysm or dissection. No significant stenosis of the major arch vessel origins. Right carotid system: Minimal atherosclerotic changes in the right carotid bifurcation. No evidence of dissection, stenosis or occlusion. Left carotid system: No evidence of dissection, stenosis or occlusion. Vertebral arteries: Codominant. No evidence of dissection, stenosis (50% or greater) or occlusion. Skeleton: Degenerative changes of the cervical spine. Other neck: No acute findings. Upper chest: Bilateral apical scarring Review of the MIP images confirms the above findings CTA HEAD FINDINGS Anterior circulation: No significant stenosis, proximal occlusion, aneurysm, or vascular malformation. Posterior circulation: No significant stenosis, proximal occlusion, aneurysm, or vascular malformation. Venous sinuses: As permitted by contrast timing, patent. Anatomic variants: None. Review of the MIP images confirms the above findings IMPRESSION: 1. No acute intracranial abnormality. 2. No large vessel occlusion, hemodynamically significant stenosis, or evidence of dissection. Electronically Signed   By: 72m M.D.   On: 10/11/2020 15:48   CT Angio Neck W and/or Wo Contrast  Result Date: 10/11/2020 CLINICAL DATA:  Dizziness, persistent/recurrent, cardiac or vascular cause suspected. EXAM: CT ANGIOGRAPHY HEAD AND NECK TECHNIQUE:  Multidetector CT imaging of the head and neck was performed  using the standard protocol during bolus administration of intravenous contrast. Multiplanar CT image reconstructions and MIPs were obtained to evaluate the vascular anatomy. Carotid stenosis measurements (when applicable) are obtained utilizing NASCET criteria, using the distal internal carotid diameter as the denominator. CONTRAST:  77mL OMNIPAQUE IOHEXOL 350 MG/ML SOLN COMPARISON:  None. FINDINGS: CT HEAD FINDINGS Brain: No evidence of acute infarction, hemorrhage, hydrocephalus, extra-axial collection or mass lesion/mass effect. Vascular: No hyperdense vessel or unexpected calcification. Skull: Normal. Negative for fracture or focal lesion. Sinuses: Imaged portions are clear. Orbits: No acute finding. Review of the MIP images confirms the above findings CTA NECK FINDINGS Aortic arch: Standard branching. Imaged portion shows no evidence of aneurysm or dissection. No significant stenosis of the major arch vessel origins. Right carotid system: Minimal atherosclerotic changes in the right carotid bifurcation. No evidence of dissection, stenosis or occlusion. Left carotid system: No evidence of dissection, stenosis or occlusion. Vertebral arteries: Codominant. No evidence of dissection, stenosis (50% or greater) or occlusion. Skeleton: Degenerative changes of the cervical spine. Other neck: No acute findings. Upper chest: Bilateral apical scarring Review of the MIP images confirms the above findings CTA HEAD FINDINGS Anterior circulation: No significant stenosis, proximal occlusion, aneurysm, or vascular malformation. Posterior circulation: No significant stenosis, proximal occlusion, aneurysm, or vascular malformation. Venous sinuses: As permitted by contrast timing, patent. Anatomic variants: None. Review of the MIP images confirms the above findings IMPRESSION: 1. No acute intracranial abnormality. 2. No large vessel occlusion, hemodynamically significant stenosis, or evidence of dissection. Electronically Signed    By: Baldemar Lenis M.D.   On: 10/11/2020 15:48   MR Brain Wo Contrast (neuro protocol)  Result Date: 10/11/2020 CLINICAL DATA:  Lightheadedness, weakness, nausea, and left-sided headache. EXAM: MRI HEAD WITHOUT CONTRAST TECHNIQUE: Multiplanar, multiecho pulse sequences of the brain and surrounding structures were obtained without intravenous contrast. COMPARISON:  Head and neck CTA 10/11/2020 and head MRI 06/27/2006 FINDINGS: Brain: There is no evidence of an acute infarct, intracranial hemorrhage, mass, midline shift, or extra-axial fluid collection. The ventricles and sulci are normal. No significant white matter disease is seen for age. Vascular: Major intracranial vascular flow voids are preserved. Skull and upper cervical spine: Unremarkable bone marrow signal. Sinuses/Orbits: Unremarkable orbits. Paranasal sinuses and mastoid air cells are clear. Other: None. IMPRESSION: Negative brain MRI. Electronically Signed   By: Sebastian Ache M.D.   On: 10/11/2020 19:35   DG Chest Port 1 View  Result Date: 10/11/2020 CLINICAL DATA:  Cough EXAM: PORTABLE CHEST 1 VIEW COMPARISON:  October 01, 2013 FINDINGS: The lungs are clear. Heart is slightly enlarged with pulmonary vascularity normal. No adenopathy. No bone lesions. IMPRESSION: Heart slightly enlarged.  No edema or airspace opacity. Electronically Signed   By: Bretta Bang III M.D.   On: 10/11/2020 13:54     Procedures  MDM   Patient's MRI has returned with no evidence of stroke or other acute abnormalities.  Her dizziness has continued to improve and she has been able to ambulate here in the ED with steady gait.  Given that her symptoms have continued to improve do not feel that patient needs neurologic consult with normal MRI.  Case discussed with Dr. Jacqulyn Bath who is in agreement with this plan.  Will discharge with meclizine and close PCP follow-up.  Patient expresses understanding and agreement with this plan.  Discharged home in good  condition.      Dartha Lodge, PA-C  10/11/20 2100    Maia Plan, MD 10/12/20 [phone]

## 2020-10-11 NOTE — ED Triage Notes (Signed)
Patient reports "feeling lightheaded and weak this morning" 0500, with nausea and slight headache on left side of head, denies dizziness.  Patient denies any symptoms last night, went to bed at 2200.  Grips are strong bilaterally, no slurred speech, no facial droop.

## 2020-10-11 NOTE — ED Notes (Signed)
Report given to Jamie RN at [place] 

## 2020-10-11 NOTE — Discharge Instructions (Signed)
Your evaluation today has been very reassuring, we do not see any evidence of stroke.  Symptoms could be due to issues with your inner ear, you can do the exercises provided on your paperwork today to help, dehydration can also contribute so please make sure you are drinking plenty of fluids.  Please follow-up closely in the next few days with your primary care doctor.  Return for any new or worsening symptoms.

## 2020-10-11 NOTE — ED Provider Notes (Signed)
[facility] EMERGENCY DEPT Provider Note   CSN: [id number] Arrival date & time: 10/11/20  1251     History Chief Complaint  Patient presents with  . Weakness  . Nausea    Kathy Douglas is a 60 y.o. female.  She has a history of hypertension and mitral valve prolapse.  Complaining of feeling lightheaded this morning with some nausea.  Worse with ambulation.  Mild left temporal headache has been going on for a few weeks and gradually improving.  Chronic diplopia no change.  No chest pain or shortness of breath.  No room spinning.  Nonproductive mild cough.  No abdominal pain vomiting or diarrhea.  Chronic constipation.  No urinary symptoms.  No vaginal bleeding or discharge.  No focal numbness or weakness.  The history is provided by the patient.  Weakness Severity:  Moderate Onset quality:  Unable to specify Timing:  Constant Progression:  Unchanged Chronicity:  New Context: not change in medication and not recent infection   Relieved by:  None tried Worsened by:  Activity Ineffective treatments:  None tried Associated symptoms: cough, headaches and nausea   Associated symptoms: no abdominal pain, no chest pain, no diarrhea, no dysphagia, no dysuria, no numbness in extremities, no falls, no fever, no foul-smelling urine, no frequency, no hematochezia, no loss of consciousness, no melena, no sensory-motor deficit, no shortness of breath, no stroke symptoms, no syncope, no vision change and no vomiting        Past Medical History:  Diagnosis Date  . Chest pain    a. 09/2013 Cath: nl cors, EF 55-65%.  . Mitral valve prolapse    mild to moderate mitral regurgitation, moderate LV dysfunction  . Osteopenia 06/2010   T score -1.44 of the hip Followup DEXA 2014 normal.  . Refusal of blood transfusions as patient is Jehovah's Witness    Jehovah witness  . Simple hepatic cyst    a. incidentally noted on CT 09/2013    Patient Active Problem List   Diagnosis Date  Noted  . Essential hypertension 05/02/2015  . Mitral valve prolapse 02/15/2014  . Midsternal chest pain 10/02/2013  . Intermediate coronary syndrome (HCC) 10/01/2013  . Unstable angina (HCC) 10/01/2013    Past Surgical History:  Procedure Laterality Date  . LEFT HEART CATHETERIZATION WITH CORONARY ANGIOGRAM N/A 10/01/2013   Procedure: LEFT HEART CATHETERIZATION WITH CORONARY ANGIOGRAM;  Surgeon: Peter M Swaziland, MD;  Location: [facility] CATH LAB;  Service: Cardiovascular;  Laterality: N/A;  . TUBAL LIGATION  1975  . VAGINAL HYSTERECTOMY  11/2007   LAVH,BSO Leiomyomata/Menorrhagia  . WISDOM TOOTH EXTRACTION       OB History    Gravida  2   Para  2   Term      Preterm      AB      Living  2     SAB      IAB      Ectopic      Multiple      Live Births              Family History  Problem Relation Age of Onset  . Hypertension Mother   . Hypertension Father   . Heart disease Father        Heart attack at 25 (passed) after surgery  . Heart disease Brother        First heart attack age 77, also has CHF  . Cancer Brother        Prostate,Blood  .  Hypertension Brother   . Diabetes Paternal Uncle   . Hypertension Sister   . Diabetes Paternal Aunt     Social History   Tobacco Use  . Smoking status: Never Smoker  . Smokeless tobacco: Never Used  Vaping Use  . Vaping Use: Never used  Substance Use Topics  . Alcohol use: No    Alcohol/week: 0.0 standard drinks  . Drug use: No    Home Medications Prior to Admission medications   Medication Sig Start Date End Date Taking? Authorizing Provider  amLODipine (NORVASC) 5 MG tablet Take 5 mg by mouth daily.    [provider]  Cholecalciferol (VITAMIN D3) 1000 UNITS CAPS Take 1,000 Units by mouth daily.    [provider]  Multiple Vitamins-Iron (MULTIVITAMIN/IRON PO) Take by mouth.      [provider]    Allergies    Patient has no known allergies.  Review of Systems   Review of  Systems  Constitutional: Negative for fever.  Eyes: Negative for visual disturbance.  Respiratory: Positive for cough. Negative for shortness of breath.   Cardiovascular: Negative for chest pain, leg swelling and syncope.  Gastrointestinal: Positive for nausea. Negative for abdominal pain, diarrhea, dysphagia, hematochezia, melena and vomiting.  Genitourinary: Negative for dysuria and frequency.  Musculoskeletal: Negative for falls and neck pain.  Skin: Negative for rash.  Neurological: Positive for weakness and headaches. Negative for loss of consciousness.    Physical Exam Updated Vital Signs BP (!) 155/81 (BP Location: Right Arm)   Pulse 86   Temp 97.8 F (36.6 C) (Oral)   Resp 18   Ht  (1.702 m)   Wt 56.7 kg   SpO2 99%   BMI 19.58 kg/m   Physical Exam Vitals and nursing note reviewed.  Constitutional:      General: She is not in acute distress.    Appearance: Normal appearance. She is well-developed.  HENT:     Head: Normocephalic and atraumatic.  Eyes:     Conjunctiva/sclera: Conjunctivae normal.  Cardiovascular:     Rate and Rhythm: Normal rate and regular rhythm.     Heart sounds: Murmur heard.    Pulmonary:     Effort: Pulmonary effort is normal. No respiratory distress.     Breath sounds: Normal breath sounds.  Abdominal:     Palpations: Abdomen is soft.     Tenderness: There is no abdominal tenderness. There is no guarding or rebound.  Musculoskeletal:     Cervical back: Neck supple.  Skin:    General: Skin is warm and dry.     Capillary Refill: Capillary refill takes less than 2 seconds.  Neurological:     General: No focal deficit present.     Mental Status: She is alert and oriented to person, place, and time.     Cranial Nerves: No cranial nerve deficit.     Sensory: No sensory deficit.     Motor: No weakness.     ED Results / Procedures / Treatments   Labs (all labs ordered are listed, but only abnormal results are displayed) Labs  Reviewed  COMPREHENSIVE METABOLIC PANEL - Abnormal; Notable for the following components:      Result Value   Glucose, Bld 100 (*)    Creatinine, Ser 1.02 (*)    All other components within normal limits  CBC WITH DIFFERENTIAL/PLATELET - Abnormal; Notable for the following components:   Lymphs Abs 0.6 (*)    All other components within normal limits  URINALYSIS, ROUTINE W REFLEX MICROSCOPIC - Abnormal; Notable for the following components:   Color, Urine COLORLESS (*)    Specific Gravity, Urine 1.004 (*)    All other components within normal limits  LIPASE, BLOOD  MAGNESIUM  TROPONIN I (HIGH SENSITIVITY)    EKG EKG Interpretation  Date/Time:  Wednesday October 11 2020 13:24:02 EDT Ventricular Rate:  76 PR Interval:    QRS Duration: 101 QT Interval:  400 QTC Calculation: 450 R Axis:   64 Text Interpretation: Sinus rhythm ectopy improved from prior today Confirmed by Meridee ScoreButler, Lynzy Rawles [phone] on 10/11/2020 1:28:32 PM   Radiology CT Angio Head W or Wo Contrast  Result Date: 10/11/2020 CLINICAL DATA:  Dizziness, persistent/recurrent, cardiac or vascular cause suspected. EXAM: CT ANGIOGRAPHY HEAD AND NECK TECHNIQUE: Multidetector CT imaging of the head and neck was performed using the standard protocol during bolus administration of intravenous contrast. Multiplanar CT image reconstructions and MIPs were obtained to evaluate the vascular anatomy. Carotid stenosis measurements (when applicable) are obtained utilizing NASCET criteria, using the distal internal carotid diameter as the denominator. CONTRAST:  75mL OMNIPAQUE IOHEXOL 350 MG/ML SOLN COMPARISON:  None. FINDINGS: CT HEAD FINDINGS Brain: No evidence of acute infarction, hemorrhage, hydrocephalus, extra-axial collection or mass lesion/mass effect. Vascular: No hyperdense vessel or unexpected calcification. Skull: Normal. Negative for fracture or focal lesion. Sinuses: Imaged portions are clear. Orbits: No acute finding. Review of the  MIP images confirms the above findings CTA NECK FINDINGS Aortic arch: Standard branching. Imaged portion shows no evidence of aneurysm or dissection. No significant stenosis of the major arch vessel origins. Right carotid system: Minimal atherosclerotic changes in the right carotid bifurcation. No evidence of dissection, stenosis or occlusion. Left carotid system: No evidence of dissection, stenosis or occlusion. Vertebral arteries: Codominant. No evidence of dissection, stenosis (50% or greater) or occlusion. Skeleton: Degenerative changes of the cervical spine. Other neck: No acute findings. Upper chest: Bilateral apical scarring Review of the MIP images confirms the above findings CTA HEAD FINDINGS Anterior circulation: No significant stenosis, proximal occlusion, aneurysm, or vascular malformation. Posterior circulation: No significant stenosis, proximal occlusion, aneurysm, or vascular malformation. Venous sinuses: As permitted by contrast timing, patent. Anatomic variants: None. Review of the MIP images confirms the above findings IMPRESSION: 1. No acute intracranial abnormality. 2. No large vessel occlusion, hemodynamically significant stenosis, or evidence of dissection. Electronically Signed   By: Baldemar LenisKatyucia  De Macedo Rodrigues M.D.   On: 10/11/2020 15:48   CT Angio Neck W and/or Wo Contrast  Result Date: 10/11/2020 CLINICAL DATA:  Dizziness, persistent/recurrent, cardiac or vascular cause suspected. EXAM: CT ANGIOGRAPHY HEAD AND NECK TECHNIQUE: Multidetector CT imaging of the head and neck was performed using the standard protocol during bolus administration of intravenous contrast. Multiplanar CT image reconstructions and MIPs were obtained to evaluate the vascular anatomy. Carotid stenosis measurements (when applicable) are obtained utilizing NASCET criteria, using the distal internal carotid diameter as the denominator. CONTRAST:  75mL OMNIPAQUE IOHEXOL 350 MG/ML SOLN COMPARISON:  None. FINDINGS: CT  HEAD FINDINGS Brain: No evidence of acute infarction, hemorrhage, hydrocephalus, extra-axial collection or mass lesion/mass effect. Vascular: No hyperdense vessel or unexpected calcification. Skull: Normal. Negative for fracture or focal lesion. Sinuses: Imaged portions are clear. Orbits: No acute finding. Review of the MIP images confirms the above findings CTA NECK FINDINGS Aortic arch: Standard branching. Imaged portion shows no evidence of aneurysm or dissection. No significant stenosis of the major arch vessel origins. Right carotid system: Minimal atherosclerotic changes in the right carotid bifurcation.  No evidence of dissection, stenosis or occlusion. Left carotid system: No evidence of dissection, stenosis or occlusion. Vertebral arteries: Codominant. No evidence of dissection, stenosis (50% or greater) or occlusion. Skeleton: Degenerative changes of the cervical spine. Other neck: No acute findings. Upper chest: Bilateral apical scarring Review of the MIP images confirms the above findings CTA HEAD FINDINGS Anterior circulation: No significant stenosis, proximal occlusion, aneurysm, or vascular malformation. Posterior circulation: No significant stenosis, proximal occlusion, aneurysm, or vascular malformation. Venous sinuses: As permitted by contrast timing, patent. Anatomic variants: None. Review of the MIP images confirms the above findings IMPRESSION: 1. No acute intracranial abnormality. 2. No large vessel occlusion, hemodynamically significant stenosis, or evidence of dissection. Electronically Signed   By: Baldemar Lenis M.D.   On: 10/11/2020 15:48   DG Chest Port 1 View  Result Date: 10/11/2020 CLINICAL DATA:  Cough EXAM: PORTABLE CHEST 1 VIEW COMPARISON:  October 01, 2013 FINDINGS: The lungs are clear. Heart is slightly enlarged with pulmonary vascularity normal. No adenopathy. No bone lesions. IMPRESSION: Heart slightly enlarged.  No edema or airspace opacity. Electronically Signed    By: Bretta Bang III M.D.   On: 10/11/2020 13:54    Procedures Procedures   Medications Ordered in ED Medications  ondansetron (ZOFRAN) injection 4 mg (has no administration in time range)  sodium chloride 0.9 % bolus 1,000 mL (has no administration in time range)    ED Course  I have reviewed the triage vital signs and the nursing notes.  Pertinent labs & imaging results that were available during my care of the patient were reviewed by me and considered in my medical decision making (see chart for details).  Clinical Course as of 10/11/20 1620  Wed Oct 11, 2020  1328 Patient ambulated in the department.  Her gait was slow and she was somewhat tentative. [MB]  1341 Patient noted on monitor to be having frequent PVCs.  Initial EKG reflected same although subsequent EKG did not show any ectopy. [MB]  1345 Chest x-ray interpreted by me as no acute infiltrates.  Awaiting radiology reading. [MB]    Clinical Course User Index [MB] Terrilee Files, MD   MDM Rules/Calculators/A&P                         This patient complains of dizziness lightheadedness nausea imbalance fatigue; this involves an extensive number of treatment Options and is a complaint that carries with it a high risk of complications and Morbidity. The differential includes dehydration, stroke, bleed, metabolic derangement, ACS, anemia, infection  I ordered, reviewed and interpreted labs, which included CBC normal chemistries fairly normal other than mildly elevated glucose and creatinine, urinalysis without signs of infection, troponin flat, magnesium normal I ordered medication IV fluids with minimal change in her symptoms I ordered imaging studies which included chest x-ray and CTA head and neck and I independently    visualized and interpreted imaging which showed no acute findings Additional history obtained from patient's husband Previous records obtained and reviewed in epic, no recent  admissions  After the interventions stated above, I reevaluated the patient and found patient still to be minimally symptomatic when sitting in bed.  She feels worse with ambulation.  Her care is signed out to oncoming provider Dr. Lockie Mola to follow-up on readings of CTA.  May need an MRI to fully exclude posterior stroke.   Final Clinical Impression(s) / ED Diagnoses Final diagnoses:  Lightheadedness  Nausea  Rx / DC Orders ED Discharge Orders    None       Terrilee Files, MD 10/11/20 1623

## 2020-10-11 NOTE — ED Notes (Signed)
Patient transported to MRI 

## 2020-10-11 NOTE — ED Notes (Signed)
Patient able to ambulate unassisted; gait steady.

## 2020-11-23 ENCOUNTER — Other Ambulatory Visit: Payer: Self-pay | Admitting: Physician Assistant

## 2020-11-23 DIAGNOSIS — Z1231 Encounter for screening mammogram for malignant neoplasm of breast: Secondary | ICD-10-CM

## 2021-01-15 ENCOUNTER — Other Ambulatory Visit: Payer: Self-pay

## 2021-01-15 ENCOUNTER — Ambulatory Visit
Admission: RE | Admit: 2021-01-15 | Discharge: 2021-01-15 | Disposition: A | Discharge status: Home / Self Care | Source: Ambulatory Visit | Attending: Physician Assistant | Admitting: Physician Assistant

## 2021-01-15 DIAGNOSIS — Z1231 Encounter for screening mammogram for malignant neoplasm of breast: Secondary | ICD-10-CM

## 2021-12-11 ENCOUNTER — Telehealth: Payer: Self-pay | Admitting: Cardiovascular Disease

## 2021-12-11 NOTE — Telephone Encounter (Signed)
Spoke to patient she stated she has been having chest pressure,sob,palpitations,weakness since last Wed.12/05/21.Stated she saw PCP and was advised to see Dr.Berry.Appointment scheduled with Dr.Berry 5/19 at 10:45 am.  ?

## 2021-12-11 NOTE — Telephone Encounter (Signed)
Patient c/o Palpitations:  High priority if patient c/o lightheadedness, shortness of breath, or chest pain ? ?How long have you had palpitations/irregular HR/ Afib? Are you having the symptoms now? Last Wednesday. Yes.  ? ?Are you currently experiencing lightheadedness, SOB or CP? Lightheaded, weak and SOB.  ? ?Do you have a history of afib (atrial fibrillation) or irregular heart rhythm? No ? ?Have you checked your BP or HR? (document readings if available): 122/72 ? ?Are you experiencing any other symptoms? Pt also states she is experiencing a tightness in her chest on occasion.  ? ?Pt said she had EKG done at PCP yesterday, but it did not show anything at that time, but was advised by PCP to reach out to her cardiologist.  ?

## 2021-12-14 ENCOUNTER — Ambulatory Visit (INDEPENDENT_AMBULATORY_CARE_PROVIDER_SITE_OTHER): Admitting: Cardiovascular Disease

## 2021-12-14 ENCOUNTER — Encounter: Payer: Self-pay | Admitting: Cardiovascular Disease

## 2021-12-14 ENCOUNTER — Ambulatory Visit (INDEPENDENT_AMBULATORY_CARE_PROVIDER_SITE_OTHER)

## 2021-12-14 DIAGNOSIS — R002 Palpitations: Secondary | ICD-10-CM

## 2021-12-14 DIAGNOSIS — I341 Nonrheumatic mitral (valve) prolapse: Secondary | ICD-10-CM | POA: Diagnosis not present

## 2021-12-14 DIAGNOSIS — I10 Essential (primary) hypertension: Secondary | ICD-10-CM

## 2021-12-14 MED ORDER — METOPROLOL SUCCINATE ER 25 MG PO TB24: 25.0000 mg | ORAL_TABLET | Freq: Every day | ORAL | 3 refills | Status: DC

## 2021-12-14 NOTE — Progress Notes (Signed)
12/14/2021 Kathy Douglas   1961-05-12  [id number]  Primary Physician Jordan Hawks, PA-C Primary Cardiologist: Runell Gess MD Nicholes Calamity, [place]  HPI:  Kathy Douglas is a 61 y.o.  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 Merri Brunette for evaluation of murmur and mitral valve prolapse demonstrated on recent 2-D echo. I last saw her in the office   12/28/2019. She has no cardiac risk factors other than family history. Father died of myocardial infarction at age 34 and her brother had an MI at age 56. She's never had a heart attack or stroke. She denies chest pain but does have mild dyspnea. She was hospitalized from 10/01/13 overnight for chest pain and underwent cardiac catheterization by Dr. [place] revealing normal coronaries and normal left ventricular function. A 2-D echocardiogram performed 05/17/15 revealed bileaflet mitral valve prolapse, trivial mitral regurgitation and mild LV dysfunction with an EF of 45-50%. Her most recent echo performed 06/11/16 revealed normal LV systolic function with mild mitral valve prolapse and mild MR.    Since I saw her in the office 2 years ago she did develop palpitations, dizziness, shortness of breath and atypical chest pain approximately week ago.  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  This was unchanged compared to the echo performed a year and a half prior.   Current Meds  Medication Sig   amLODipine (NORVASC) 5 MG tablet Take 5 mg by mouth daily.   Cholecalciferol (VITAMIN D3) 1000 UNITS CAPS Take 1,000 Units by mouth daily.   meclizine (ANTIVERT) 25 MG tablet Take 1 tablet (25 mg total) by mouth 3 (three) times daily as needed for dizziness.   Multiple Vitamins-Iron (MULTIVITAMIN/IRON PO) Take by mouth.       No  Known Allergies  Social History   Socioeconomic History   Marital status: Married    Spouse name: Not on file   Number of children: Not on file   Years of education: Not on file   Highest education level: Not on file  Occupational History    Employer: [employer]    Comment: Accounting  Tobacco Use   Smoking status: Never   Smokeless tobacco: Never  Vaping Use   Vaping Use: Never used  Substance and Sexual Activity   Alcohol use: No    Alcohol/week: 0.0 standard drinks   Drug use: No   Sexual activity: Not Currently    Comment: 1st intercourse 15 yo-5 partners  Other Topics Concern   Not on file  Social History Narrative   Not on file   Social Determinants of Health   Financial Resource Strain: Not on file  Food Insecurity: Not on file  Transportation Needs: Not on file  Physical Activity: Not on file  Stress: Not on file  Social Connections: Not on file  Intimate Partner Violence: Not on file     Review of Systems: General: negative for chills, fever, night sweats or weight changes.  Cardiovascular: negative for chest pain, dyspnea on exertion, edema, orthopnea, palpitations, paroxysmal nocturnal dyspnea or shortness of breath Dermatological: negative for rash Respiratory: negative for cough or wheezing Urologic: negative for hematuria Abdominal: negative for nausea, vomiting, diarrhea, bright red blood per rectum, melena, or hematemesis Neurologic: negative for visual changes, syncope,  or dizziness All other systems reviewed and are otherwise negative except as noted above.    Blood pressure 140/77, pulse 91, height 5\' 7"  (1.702 m), weight 121 lb 12.8 oz (55.2 kg), SpO2 98 %.  General appearance: alert and no distress Neck: no adenopathy, no carotid bruit, no JVD, supple, symmetrical, trachea midline, and thyroid not enlarged, symmetric, no tenderness/mass/nodules Lungs: clear to auscultation bilaterally Heart: 3/6 holosystolic apical murmur as well  as a 2/6 outflow tract murmur. Extremities: extremities normal, atraumatic, no cyanosis or edema Pulses: 2+ and symmetric Skin: Skin color, texture, turgor normal. No rashes or lesions Neurologic: Grossly normal  EKG sinus rhythm at 91 with frequent P VCs and PACs.  I personally reviewed this EKG.  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  This was unchanged compared to her echo performed a year and a half prior to that.  She does have a loud apical systolic murmur and an outflow tract murmur on exam today.  I am going to repeat a 2D echocardiogram.  Essential hypertension History of essential hypertension a blood pressure measured today at 140/77.  She is on amlodipine.  Palpitations EKG today shows frequent PVCs and PACs.  She is symptomatic from this.  I am going to begin her on low-dose beta-blocker.     01/16/2020 MD FACP,FACC,FAHA, [facility] 12/14/2021 11:32 AM

## 2021-12-14 NOTE — Assessment & Plan Note (Signed)
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  This was unchanged compared to her echo performed a year and a half prior to that.  She does have a loud apical systolic murmur and an outflow tract murmur on exam today.  I am going to repeat a 2D echocardiogram.

## 2021-12-14 NOTE — Assessment & Plan Note (Signed)
EKG today shows frequent PVCs and PACs.  She is symptomatic from this.  I am going to begin her on low-dose beta-blocker.

## 2021-12-14 NOTE — Patient Instructions (Signed)
Medication Instructions:   -Start metoprolol succinate (toprol-xl) 25mg  once daily.  *If you need a refill on your cardiac medications before your next appointment, please call your pharmacy*    Testing/Procedures: Your physician has requested that you have an echocardiogram. Echocardiography is a painless test that uses sound waves to create images of your heart. It provides your doctor with information about the size and shape of your heart and how well your heart's chambers and valves are working. This procedure takes approximately one hour. There are no restrictions for this procedure.    ZIO XT- Long Term Monitor Instructions  Your physician has requested you wear a ZIO patch monitor for 14 days.  This is a single patch monitor. [employer] supplies one patch monitor per enrollment. Additional stickers are not available. Please do not apply patch if you will be having a Nuclear Stress Test,  Echocardiogram, Cardiac CT, MRI, or Chest Xray during the period you would be wearing the  monitor. The patch cannot be worn during these tests. You cannot remove and re-apply the  ZIO XT patch monitor.  Your ZIO patch monitor will be mailed 3 day USPS to your address on file. It may take 3-5 days  to receive your monitor after you have been enrolled.  Once you have received your monitor, please review the enclosed instructions. Your monitor  has already been registered assigning a specific monitor serial # to you.  Billing and Patient Assistance Program Information  We have supplied [employer] with any of your insurance information on file for billing purposes. [employer] offers a sliding scale Patient Assistance Program for patients that do not have  insurance, or whose insurance does not completely cover the cost of the ZIO monitor.  You must apply for the Patient Assistance Program to qualify for this discounted rate.  To apply, please call [employer] at [phone], select option 4, select option 2,  ask to apply for  Patient Assistance Program. [phone] will ask your household income, and how many people  are in your household. They will quote your out-of-pocket cost based on that information.  [employer] will also be able to set up a 92-month, interest-free payment plan if needed.  Applying the monitor   Shave hair from upper left chest.  Hold abrader disc by orange tab. Rub abrader in 40 strokes over the upper left chest as  indicated in your monitor instructions.  Clean area with 4 enclosed alcohol pads. Let dry.  Apply patch as indicated in monitor instructions. Patch will be placed under collarbone on left  side of chest with arrow pointing upward.  Rub patch adhesive wings for 2 minutes. Remove white label marked "1". Remove the white  label marked "2". Rub patch adhesive wings for 2 additional minutes.  While looking in a mirror, press and release button in center of patch. A small green light will  flash 3-4 times. This will be your only indicator that the monitor has been turned on.  Do not shower for the first 24 hours. You may shower after the first 24 hours.  Press the button if you feel a symptom. You will hear a small click. Record Date, Time and  Symptom in the Patient Logbook.  When you are ready to remove the patch, follow instructions on the last 2 pages of Patient  Logbook. Stick patch monitor onto the last page of Patient Logbook.  Place Patient Logbook in the blue and white box. Use locking tab on box and tape box  closed  securely. The blue and white box has prepaid postage on it. Please place it in the mailbox as  soon as possible. Your physician should have your test results approximately 7 days after the  monitor has been mailed back to [facility].  Call [facility] Customer Care at [phone] if you have questions regarding  your ZIO XT patch monitor. Call them immediately if you see an orange light blinking on your  monitor.  If your monitor falls off  in less than 4 days, contact our Monitor department at [phone].  If your monitor becomes loose or falls off after 4 days call [employer] at [phone] for  suggestions on securing your monitor    Follow-Up: At [facility], you and your health needs are our priority.  As part of our continuing mission to provide you with exceptional heart care, we have created designated Provider Care Teams.  These Care Teams include your primary Cardiologist (physician) and Advanced Practice Providers (APPs -  Physician Assistants and Nurse Practitioners) who all work together to provide you with the care you need, when you need it.  We recommend signing up for the patient portal called "MyChart".  Sign up information is provided on this After Visit Summary.  MyChart is used to connect with patients for Virtual Visits (Telemedicine).  Patients are able to view lab/test results, encounter notes, upcoming appointments, etc.  Non-urgent messages can be sent to your provider as well.   To learn more about what you can do with MyChart, go to [email or web address].    Your next appointment:   4 week(s)  The format for your next appointment:   In Person  Provider:   Nanetta Batty, MD

## 2021-12-14 NOTE — Assessment & Plan Note (Signed)
History of essential hypertension a blood pressure measured today at 140/77.  She is on amlodipine.

## 2021-12-14 NOTE — Progress Notes (Unsigned)
Enrolled patient for a 14 day Zio XT  monitor to be mailed to patients home  °

## 2021-12-17 ENCOUNTER — Ambulatory Visit (HOSPITAL_COMMUNITY)
Admission: RE | Admit: 2021-12-17 | Discharge: 2021-12-17 | Disposition: A | Discharge status: Home / Self Care | Source: Ambulatory Visit | Attending: Physician Assistant | Admitting: Physician Assistant

## 2021-12-17 DIAGNOSIS — I10 Essential (primary) hypertension: Secondary | ICD-10-CM

## 2021-12-17 DIAGNOSIS — R002 Palpitations: Secondary | ICD-10-CM | POA: Diagnosis not present

## 2021-12-17 DIAGNOSIS — I340 Nonrheumatic mitral (valve) insufficiency: Secondary | ICD-10-CM | POA: Diagnosis not present

## 2021-12-17 DIAGNOSIS — I341 Nonrheumatic mitral (valve) prolapse: Secondary | ICD-10-CM

## 2021-12-17 LAB — ECHOCARDIOGRAM COMPLETE
AR max vel: 2.5 cm2
AV Peak grad: 4.8 mmHg
Ao pk vel: 1.1 m/s
MV M vel: 5.15 m/s
MV Peak grad: 106 mmHg
S' Lateral: 3.1 cm

## 2021-12-28 ENCOUNTER — Ambulatory Visit: Admitting: Cardiovascular Disease

## 2021-12-31 ENCOUNTER — Other Ambulatory Visit (HOSPITAL_BASED_OUTPATIENT_CLINIC_OR_DEPARTMENT_OTHER)

## 2022-01-15 ENCOUNTER — Encounter: Payer: Self-pay | Admitting: Cardiovascular Disease

## 2022-01-15 ENCOUNTER — Ambulatory Visit (INDEPENDENT_AMBULATORY_CARE_PROVIDER_SITE_OTHER): Admitting: Cardiovascular Disease

## 2022-01-15 VITALS — BP 120/88 | HR 92 | Ht 67.0 in | Wt 124.0 lb

## 2022-01-15 DIAGNOSIS — I10 Essential (primary) hypertension: Secondary | ICD-10-CM | POA: Diagnosis not present

## 2022-01-15 DIAGNOSIS — I341 Nonrheumatic mitral (valve) prolapse: Secondary | ICD-10-CM | POA: Diagnosis not present

## 2022-01-15 DIAGNOSIS — R002 Palpitations: Secondary | ICD-10-CM

## 2022-01-15 NOTE — Patient Instructions (Signed)
Medication Instructions:  Your physician recommends that you continue on your current medications as directed. Please refer to the Current Medication list given to you today.  *If you need a refill on your cardiac medications before your next appointment, please call your pharmacy*   Lab Work: Your physician recommends that you have labs drawn today: TSH/Free T4  If you have labs (blood work) drawn today and your tests are completely normal, you will receive your results only by: MyChart Message (if you have MyChart) OR A paper copy in the mail If you have any lab test that is abnormal or we need to change your treatment, we will call you to review the results.   Follow-Up: At [facility], you and your health needs are our priority.  As part of our continuing mission to provide you with exceptional heart care, we have created designated Provider Care Teams.  These Care Teams include your primary Cardiologist (physician) and Advanced Practice Providers (APPs -  Physician Assistants and Nurse Practitioners) who all work together to provide you with the care you need, when you need it.  We recommend signing up for the patient portal called "MyChart".  Sign up information is provided on this After Visit Summary.  MyChart is used to connect with patients for Virtual Visits (Telemedicine).  Patients are able to view lab/test results, encounter notes, upcoming appointments, etc.  Non-urgent messages can be sent to your provider as well.   To learn more about what you can do with MyChart, go to [email or web address].    Your next appointment:   6 month(s)  The format for your next appointment:   In Person  Provider:   Micah Flesher, PA-C, Marjie Skiff, PA-C, Juanda Crumble, PA-C, Joni Reining, DNP, ANP, Azalee Course, PA-C, or Bernadene Person, NP       Then, Nanetta Batty, MD will plan to see you again in 12 month(s).

## 2022-01-15 NOTE — Progress Notes (Signed)
Kathy Douglas returns today for follow-up of her outpatient diagnostic tests done because of shortness of breath, atypical chest pain, palpitations and dizziness.  Her event monitor showed frequent PVCs, short runs of NSVT and SVT and occasional 3-second pauses.  Her symptoms since I saw her a month ago have resolved.  At this point, I do not feel compelled to further investigate.  Her 2D echo was unchanged from the one performed 7/21.  She has bileaflet mitral prolapse with mild to moderate MR and normal LV systolic function.  She no longer has shortness of breath or chest pain.  I am going to repeat her 2D echo cardiogram in 12 months.  She will see an APP back in 6 to me in 12 months.  We will check thyroid function tests.  Kathy Douglas, M.D., FACP, [facility], [facility] [address] [place], [place]  [address]  [phone] 01/15/2022 8:32 AM

## 2022-01-16 LAB — TSH+FREE T4
Free T4: 1.34 ng/dL (ref 0.82–1.77)
TSH: 1.29 u[IU]/mL (ref 0.450–4.500)

## 2022-01-23 ENCOUNTER — Ambulatory Visit (INDEPENDENT_AMBULATORY_CARE_PROVIDER_SITE_OTHER): Admitting: Nurse Practitioner

## 2022-01-23 ENCOUNTER — Encounter: Payer: Self-pay | Admitting: Nurse Practitioner

## 2022-01-23 VITALS — BP 146/74 | HR 95 | Ht 67.0 in | Wt 124.0 lb

## 2022-01-23 DIAGNOSIS — I493 Ventricular premature depolarization: Secondary | ICD-10-CM

## 2022-01-23 DIAGNOSIS — R42 Dizziness and giddiness: Secondary | ICD-10-CM

## 2022-01-23 DIAGNOSIS — I10 Essential (primary) hypertension: Secondary | ICD-10-CM

## 2022-01-23 DIAGNOSIS — I951 Orthostatic hypotension: Secondary | ICD-10-CM | POA: Diagnosis not present

## 2022-01-23 DIAGNOSIS — I455 Other specified heart block: Secondary | ICD-10-CM

## 2022-01-23 DIAGNOSIS — R002 Palpitations: Secondary | ICD-10-CM

## 2022-01-23 DIAGNOSIS — I471 Supraventricular tachycardia: Secondary | ICD-10-CM

## 2022-01-23 DIAGNOSIS — R0789 Other chest pain: Secondary | ICD-10-CM

## 2022-01-23 DIAGNOSIS — I341 Nonrheumatic mitral (valve) prolapse: Secondary | ICD-10-CM

## 2022-01-23 DIAGNOSIS — I4729 Other ventricular tachycardia: Secondary | ICD-10-CM

## 2022-01-23 NOTE — Patient Instructions (Signed)
Medication Instructions:  Decrease Amlodipine 2.5 mg daily    *If you need a refill on your cardiac medications before your next appointment, please call your pharmacy*   Lab Work: NONE ordered at this time of appointment   If you have labs (blood work) drawn today and your tests are completely normal, you will receive your results only by: MyChart Message (if you have MyChart) OR A paper copy in the mail If you have any lab test that is abnormal or we need to change your treatment, we will call you to review the results.   Testing/Procedures: NONE ordered at this time of appointment     Follow-Up: At [facility], you and your health needs are our priority.  As part of our continuing mission to provide you with exceptional heart care, we have created designated Provider Care Teams.  These Care Teams include your primary Cardiologist (physician) and Advanced Practice Providers (APPs -  Physician Assistants and Nurse Practitioners) who all work together to provide you with the care you need, when you need it.  We recommend signing up for the patient portal called "MyChart".  Sign up information is provided on this After Visit Summary.  MyChart is used to connect with patients for Virtual Visits (Telemedicine).  Patients are able to view lab/test results, encounter notes, upcoming appointments, etc.  Non-urgent messages can be sent to your provider as well.   To learn more about what you can do with MyChart, go to [email or web address].    Your next appointment:    Keep Upcoming appointment   The format for your next appointment:   In Person  Provider:   Edd Fabian, FNP        Other Instructions Orthostatic Hypotension Blood pressure is a measurement of how strongly, or weakly, your circulating blood is pressing against the walls of your arteries. Orthostatic hypotension is a drop in blood pressure that can happen when you change positions, such as when you go from lying  down to standing. Arteries are blood vessels that carry blood from your heart throughout your body. When blood pressure is too low, you may not get enough blood to your brain or to the rest of your organs. Orthostatic hypotension can cause light-headedness, sweating, rapid heartbeat, blurred vision, and fainting. These symptoms require further investigation into the cause. What are the causes? Orthostatic hypotension can be caused by many things, including: Sudden changes in posture, such as standing up quickly after you have been sitting or lying down. Loss of blood (anemia) or loss of body fluids (dehydration). Heart problems, neurologic problems, or hormone problems. Pregnancy. Aging. The risk for this condition increases as you get older. Severe infection (sepsis). Certain medicines, such as medicines for high blood pressure or medicines that make the body lose excess fluids (diuretics). What are the signs or symptoms? Symptoms of this condition may include: Weakness, light-headedness, or dizziness. Sweating. Blurred vision. Tiredness (fatigue). Rapid heartbeat. Fainting, in severe cases. How is this diagnosed? This condition is diagnosed based on: Your symptoms and medical history. Your blood pressure measurements. Your health care provider will check your blood pressure when you are: Lying down. Sitting. Standing. A blood pressure reading is recorded as two numbers, such as "120 over 80" (or 120/80). The first ("top") number is called the systolic pressure. It is a measure of the pressure in your arteries as your heart beats. The second ("bottom") number is called the diastolic pressure. It is a measure of the pressure in your  arteries when your heart relaxes between beats. Blood pressure is measured in a unit called mmHg. Healthy blood pressure for most adults is 120/80 mmHg. Orthostatic hypotension is defined as a 20 mmHg drop in systolic pressure or a 10 mmHg drop in diastolic  pressure within 3 minutes of standing. Other information or tests that may be used to diagnose orthostatic hypotension include: Your other vital signs, such as your heart rate and temperature. Blood tests. An electrocardiogram (ECG) or echocardiogram. A Holter monitor. This is a device you wear that records your heart rhythm continuously, usually for 24-48 hours. Tilt table test. For this test, you will be safely secured to a table that moves you from a lying position to an upright position. Your heart rhythm and blood pressure will be monitored during the test. How is this treated? This condition may be treated by: Changing your diet. This may involve eating more salt (sodium) or drinking more water. Changing the dosage of certain medicines you are taking that might be lowering your blood pressure. Correcting the underlying reason for the orthostatic hypotension. Wearing compression stockings. Taking medicines to raise your blood pressure. Avoiding actions that trigger symptoms. Follow these instructions at home: Medicines Take over-the-counter and prescription medicines only as told by your health care provider. Follow instructions from your health care provider about changing the dosage of your current medicines, if this applies. Do not stop or adjust any of your medicines on your own. Eating and drinking  Drink enough fluid to keep your urine pale yellow. Eat extra salt only as directed. Do not add extra salt to your diet unless advised by your health care provider. Eat frequent, small meals. Avoid standing up suddenly after eating. General instructions  Get up slowly from lying down or sitting positions. This gives your blood pressure a chance to adjust. Avoid hot showers and excessive heat as directed by your health care provider. Engage in regular physical activity as directed by your health care provider. If you have compression stockings, wear them as told. Keep all follow-up  visits. This is important. Contact a health care provider if: You have a fever for more than 2-3 days. You feel more thirsty than usual. You feel dizzy or weak. Get help right away if: You have chest pain. You have a fast or irregular heartbeat. You become sweaty or feel light-headed. You feel short of breath. You faint. You have any symptoms of a stroke. "BE FAST" is an easy way to remember the main warning signs of a stroke: B - Balance. Signs are dizziness, sudden trouble walking, or loss of balance. E - Eyes. Signs are trouble seeing or a sudden change in vision. F - Face. Signs are sudden weakness or numbness of the face, or the face or eyelid drooping on one side. A - Arms. Signs are weakness or numbness in an arm. This happens suddenly and usually on one side of the body. S - Speech. Signs are sudden trouble speaking, slurred speech, or trouble understanding what people say. T - Time. Time to call emergency services. Write down what time symptoms started. You have other signs of a stroke, such as: A sudden, severe headache with no known cause. Nausea or vomiting. Seizure. These symptoms may represent a serious problem that is an emergency. Do not wait to see if the symptoms will go away. Get medical help right away. Call your local emergency services (911 in the [place] Do not drive yourself to the hospital. Summary Orthostatic hypotension  is a sudden drop in blood pressure. It can cause light-headedness, sweating, rapid heartbeat, blurred vision, and fainting. Orthostatic hypotension can be diagnosed by having your blood pressure taken while lying down, sitting, and then standing. Treatment may involve changing your diet, wearing compression stockings, sitting up slowly, adjusting your medicines, or correcting the underlying reason for the orthostatic hypotension. Get help right away if you have chest pain, a fast or irregular heartbeat, or symptoms of a stroke. This information  is not intended to replace advice given to you by your health care provider. Make sure you discuss any questions you have with your health care provider. Document Revised: 09/28/2020 Document Reviewed: 09/28/2020 Elsevier Patient Education  2023 [employer] Inc[employer]   Important Information About Sugar

## 2022-01-23 NOTE — Progress Notes (Signed)
This   Office Visit    Patient Name: Kathy Douglas Date of Encounter: 01/23/2022  Primary Care Provider:  Jordan Hawks, PA-C Primary Cardiologist:  Nanetta Batty, MD  Chief Complaint    61 year old with a history of chest pain with normal coronary arteries by cath in 2015, mitral valve prolapse, palpitations, PVCs, NSVT, SVT, sinus pauses, hypertension and osteopenia who presents for follow-up related to atypical chest pain, dizziness, and palpitations.  Past Medical History    Past Medical History:  Diagnosis Date   Chest pain    a. 09/2013 Cath: nl cors, EF 55-65%.   Hypertension    Mitral valve prolapse    mild to moderate mitral regurgitation, moderate LV dysfunction   Osteopenia 06/2010   T score -1.44 of the hip Followup DEXA 2014 normal.   Refusal of blood transfusions as patient is Jehovah's Witness    Jehovah witness   Simple hepatic cyst    a. incidentally noted on CT 09/2013   Past Surgical History:  Procedure Laterality Date   LEFT HEART CATHETERIZATION WITH CORONARY ANGIOGRAM N/A 10/01/2013   Procedure: LEFT HEART CATHETERIZATION WITH CORONARY ANGIOGRAM;  Surgeon: Peter M Swaziland, MD;  Location: [facility] CATH LAB;  Service: Cardiovascular;  Laterality: N/A;   TUBAL LIGATION  1975   VAGINAL HYSTERECTOMY  11/2007   LAVH,BSO Leiomyomata/Menorrhagia   WISDOM TOOTH EXTRACTION      Allergies  No Known Allergies  History of Present Illness    61 year old with the above past medical history including chest pain with normal coronary arteries by cath in 2015, mitral valve prolapse, palpitations, PVCs, NSVT, SVT, sinus pauses, hypertension and osteopenia.  She was initially referred to cardiology for evaluation of heart murmur, mitral valve prolapse on echocardiogram. She was hospitalized in March 2015 in the setting of chest pain.  Cardiac catheterization revealed normal coronary arteries, LV function.  Echocardiogram in October 2016 revealed bileaflet mitral valve  prolapse, trivial mitral valve regurgitation, mild LV dysfunction, EF 45 to 50%. She was evaluated by Dr. Gery Pray in May 2023 and reported shortness of breath, atypical chest pain, palpitations, dizziness. Repeat echocardiogram in May 2023 showed EF 60 to 65%, no RWMA, mild to moderate mitral valve regurgitation, moderate prolapse of both leaflets of the mitral valve.  Outpatient cardiac monitor showed frequent PVCs, short runs of SVT and NSVT, occasional 3-second pauses.  She was last seen in the office on 01/15/2022 and reported resolution of her symptoms. Thyroid function tests were checked and were unremarkable.  Repeat echocardiogram was recommended in 12 months.  She saw her PCP on 01/22/2022 and was noted to be orthostatic.  Repeat CT of the head was recommended.  Additionally, she was advised to follow-up with cardiology for consideration of possible stress test versus EP referral.  She presents today for follow-up.  Since her last visit she continues to note daily dizziness/lightheadedness.  She states that her symptoms are frequent, almost constant.  She has had presyncopal episodes, most recently on 01/20/2022.  Additionally, she notes frequent palpitations, which she describes as her "heart racing" with associated shortness of breath with minimal exertion.  She thinks that her symptoms worsened with initiation of metoprolol though she is not certain.  She denies chest pain.  She is concerned that her symptoms persist despite recent reassuring testing and states that her symptoms are affecting her quality of life.  Home Medications    Current Outpatient Medications  Medication Sig Dispense Refill   amLODipine (NORVASC) 5 MG tablet  Take 2.5 mg by mouth daily.     Cholecalciferol (VITAMIN D3) 1000 UNITS CAPS Take 1,000 Units by mouth daily.     meclizine (ANTIVERT) 25 MG tablet Take 1 tablet (25 mg total) by mouth 3 (three) times daily as needed for dizziness. 30 tablet 0   metoprolol succinate  (TOPROL XL) 25 MG 24 hr tablet Take 1 tablet (25 mg total) by mouth daily. 90 tablet 3   Multiple Vitamins-Iron (MULTIVITAMIN/IRON PO) Take by mouth.       No current facility-administered medications for this visit.     Review of Systems    She denies chest pain, palpitations, dyspnea, pnd, orthopnea, n, v, dizziness, syncope, edema, weight gain, or early satiety. All other systems reviewed and are otherwise negative except as noted above.   Physical Exam    VS:  BP (!) 146/74   Pulse 95   Ht 5\' 7"  (1.702 m)   Wt 124 lb (56.2 kg)   SpO2 100%   BMI 19.42 kg/m  GEN: Well nourished, well developed, in no acute distress. HEENT: normal. Neck: Supple, no JVD, carotid bruits, or masses. Cardiac: RRR, 3/6 murmur, no rubs, or gallops. No clubbing, cyanosis, edema.  Radials/DP/PT 2+ and equal bilaterally.  Respiratory:  Respirations regular and unlabored, clear to auscultation bilaterally. GI: Soft, nontender, nondistended, BS + x 4. MS: no deformity or atrophy. Skin: warm and dry, no rash. Neuro:  Strength and sensation are intact. Psych: Normal affect.  Accessory Clinical Findings    ECG personally reviewed by me today -no EKG in office today.  Lab Results  Component Value Date   WBC 5.0 10/11/2020   HGB 14.4 10/11/2020   HCT 44.8 10/11/2020   MCV 92.8 10/11/2020   PLT 173 10/11/2020   Lab Results  Component Value Date   CREATININE 1.02 (H) 10/11/2020   BUN 14 10/11/2020   NA 140 10/11/2020   K 3.8 10/11/2020   CL 103 10/11/2020   CO2 27 10/11/2020   Lab Results  Component Value Date   ALT 20 10/11/2020   AST 23 10/11/2020   ALKPHOS 42 10/11/2020   BILITOT 0.5 10/11/2020   Lab Results  Component Value Date   CHOL 166 08/24/2018   HDL 90 08/24/2018   LDLCALC 67 08/24/2018   TRIG 43 08/24/2018   CHOLHDL 1.8 08/24/2018    No results found for: "HGBA1C"  Assessment & Plan    1. Orthostatic dizziness/orthostatic hypotension/ hypertension: She notes daily  dizziness, presyncope.  Orthostatics were positive at PCP office on 01/22/2022, with increased heart rate.  I suspect this could be contributing to her overall symptoms of dizziness and palpitations.  She thinks that her symptoms worsened with addition of metoprolol.  However, given NSVT/SVT, and palpitations, I am hesitant to decrease metoprolol at this time.  Will decrease amlodipine to 2.5 mg daily.  Will likely need to allow for some permissive hypertension given severe orthostasis.    Discussed adequate hydration, gradual position changes, abdominal and thigh compression. Patient verbalized understanding. CT of the head and neck in March 2022 were overall unremarkable.  Repeat CT of the head pending per PCP.  Continue to monitor symptoms.  2. Palpitations/PVC/NSVT/SVT/sinus pauses: Outpatient cardiac monitor showed frequent PVCs, short runs of SVT and NSVT, occasional 3-second pauses.  She has noted persistent dizziness as above, presyncope, likely in the setting of orthostatic hypotension.  However, she does note palpitations which she describes as her "heart racing" and shortness of breath with minimal  exertion.  Not recommend diltiazem or increase in beta-blocker dosing at this time in the setting of sinus pauses. If symptoms persist, may need to consider EP referral, though I will discuss was first with Dr. Gwenlyn Found.  For now, continue metoprolol.  3. Atypical chest pain: Cath in 2015 showed normal  coronary arteries. Most recent echo  in May 2023 showed EF 60 to 65%, no RWMA, mild to moderate mitral valve regurgitation, moderate prolapse of both leaflets of the mitral valve. Stable with no anginal symptoms. No indication for ischemic evaluation.   4. Mitral valve prolapse: Recent echo as above.  She does have a significant murmur on exam. Repeat echocardiac recommended in 1 year.   5. Disposition: Follow-up as scheduled in December 2023, sooner if needed.  Lenna Sciara, NP 01/23/2022, 4:56 PM

## 2022-01-25 ENCOUNTER — Telehealth: Payer: Self-pay

## 2022-01-25 NOTE — Telephone Encounter (Signed)
Lmom to discuss referral needed s/p pts last office visist.

## 2022-01-25 NOTE — Progress Notes (Signed)
Lmom, waiting on a return call.  

## 2022-01-28 ENCOUNTER — Other Ambulatory Visit: Payer: Self-pay

## 2022-01-28 DIAGNOSIS — R002 Palpitations: Secondary | ICD-10-CM

## 2022-01-28 DIAGNOSIS — R42 Dizziness and giddiness: Secondary | ICD-10-CM

## 2022-02-01 ENCOUNTER — Other Ambulatory Visit: Payer: Self-pay

## 2022-02-01 DIAGNOSIS — R002 Palpitations: Secondary | ICD-10-CM

## 2022-02-01 DIAGNOSIS — I951 Orthostatic hypotension: Secondary | ICD-10-CM

## 2022-02-01 NOTE — Telephone Encounter (Signed)
Spoke with pt. Pt is aware the Bernadene Person, DNP would like to refer her to see an EP doctor for palpations with history of NSVT, SVT, sinus pauses and orthostatic hypotension. Order placed.

## 2022-02-01 NOTE — Progress Notes (Signed)
Spoke with pt. Pt is aware of referral needed. Order placed to see EP.

## 2022-04-04 ENCOUNTER — Other Ambulatory Visit: Payer: Self-pay | Admitting: Physician Assistant

## 2022-04-04 DIAGNOSIS — Z1231 Encounter for screening mammogram for malignant neoplasm of breast: Secondary | ICD-10-CM

## 2022-04-05 ENCOUNTER — Encounter: Payer: Self-pay | Admitting: Cardiology

## 2022-04-05 ENCOUNTER — Ambulatory Visit: Attending: Cardiology | Admitting: Cardiology

## 2022-04-05 VITALS — BP 128/60 | HR 97 | Ht 67.0 in | Wt 125.0 lb

## 2022-04-05 DIAGNOSIS — I493 Ventricular premature depolarization: Secondary | ICD-10-CM

## 2022-04-05 DIAGNOSIS — I471 Supraventricular tachycardia: Secondary | ICD-10-CM

## 2022-04-05 NOTE — Progress Notes (Signed)
Electrophysiology Office Note   Date:  04/05/2022   ID:  Kathy Douglas, DOB 1961/05/07, MRN [id number]  PCP:  Kathy Hawks, PA-C  Cardiologist:  Kathy Douglas Primary Electrophysiologist:  Kathy Bihm Jorja Loa, MD    Chief Complaint: SVT   History of Present Illness: Kathy Douglas is a 61 y.o. female who is being seen today for the evaluation of SVT at the request of Kathy Grapes, NP. Presenting today for electrophysiology evaluation.  She has a history significant for mitral valve prolapse, PVCs, nonsustained VT, SVT, sinus pauses, hypertension.  She had a cardiac cath in 2015 that showed normal coronary arteries.  Echo showed a bileaflet mitral valve with prolapse.  She wore an outpatient monitor that showed frequent PVCs, runs of SVT and nonsustained VT and 3-second pauses.  She was seen in the office 01/15/2022 and reported some resolution of her symptoms.  She then represented to cardiology clinic with daily dizziness and lightheadedness.  Her symptoms are frequent, almost constant.  She has had presyncopal episodes, most recently 01/20/2022.  She has noted palpitations consistent with heart racing.  She is also short of breath with minimal exertion.  She feels that her symptoms may have been exacerbated with initiation of metoprolol.  Today, she denies symptoms of palpitations, chest pain, shortness of breath, orthopnea, PND, lower extremity edema, claudication, presyncope, syncope, bleeding, or neurologic sequela. The patient is tolerating medications without difficulties.  She has many episodes of dizziness throughout the day.  Her episodes occur at times when she is lying flat in bed.  They also occur throughout the day.  She is noted no exacerbating or alleviating factors.  Despite that, she remains quite active.   Past Medical History:  Diagnosis Date   Chest pain    a. 09/2013 Cath: nl cors, EF 55-65%.   Hypertension    Mitral valve prolapse    mild to moderate mitral regurgitation,  moderate LV dysfunction   Osteopenia 06/2010   T score -1.44 of the hip Followup DEXA 2014 normal.   Refusal of blood transfusions as patient is Jehovah's Witness    Jehovah witness   Simple hepatic cyst    a. incidentally noted on CT 09/2013   Past Surgical History:  Procedure Laterality Date   LEFT HEART CATHETERIZATION WITH CORONARY ANGIOGRAM N/A 10/01/2013   Procedure: LEFT HEART CATHETERIZATION WITH CORONARY ANGIOGRAM;  Surgeon: Kathy M Swaziland, MD;  Location: [facility] CATH LAB;  Service: Cardiovascular;  Laterality: N/A;   TUBAL LIGATION  1975   VAGINAL HYSTERECTOMY  11/2007   LAVH,BSO Leiomyomata/Menorrhagia   WISDOM TOOTH EXTRACTION       Current Outpatient Medications  Medication Sig Dispense Refill   amLODipine (NORVASC) 5 MG tablet Take 2.5 mg by mouth daily.     Cholecalciferol (VITAMIN D3) 1000 UNITS CAPS Take 1,000 Units by mouth daily.     meclizine (ANTIVERT) 25 MG tablet Take 1 tablet (25 mg total) by mouth 3 (three) times daily as needed for dizziness. 30 tablet 0   metoprolol succinate (TOPROL XL) 25 MG 24 hr tablet Take 1 tablet (25 mg total) by mouth daily. 90 tablet 3   Multiple Vitamins-Iron (MULTIVITAMIN/IRON PO) Take by mouth.       No current facility-administered medications for this visit.    Allergies:   Prednisone   Social History:  The patient  reports that she has never smoked. She has never used smokeless tobacco. She reports that she does not drink alcohol and does  not use drugs.   Family History:  The patient's family history includes Cancer in her brother; Diabetes in her paternal aunt and paternal uncle; Heart disease in her brother and father; Hypertension in her brother, father, mother, and sister.    ROS:  Please see the history of present illness.   Otherwise, review of systems is positive for none.   All other systems are reviewed and negative.    PHYSICAL EXAM: VS:  BP 128/60   Pulse 97   Ht 5\' 7"  (1.702 m)   Wt 125 lb (56.7 kg)   SpO2 97%    BMI 19.58 kg/m  , BMI Body mass index is 19.58 kg/m. GEN: Well nourished, well developed, in no acute distress  HEENT: normal  Neck: no JVD, carotid bruits, or masses Cardiac: RRR; no murmurs, rubs, or gallops,no edema  Respiratory:  clear to auscultation bilaterally, normal work of breathing GI: soft, nontender, nondistended, + BS MS: no deformity or atrophy  Skin: warm and dry Neuro:  Strength and sensation are intact Psych: euthymic mood, full affect  EKG:  EKG is ordered today. Personal review of the ekg ordered shows sinus rhythm, rate 97, PVC Recent Labs: 01/15/2022: TSH 1.290    Lipid Panel     Component Value Date/Time   CHOL 166 08/24/2018 0846   TRIG 43 08/24/2018 0846   HDL 90 08/24/2018 0846   CHOLHDL 1.8 08/24/2018 0846   CHOLHDL 1.7 02/08/2015 1110   VLDL 10 02/08/2015 1110   LDLCALC 67 08/24/2018 0846     Wt Readings from Last 3 Encounters:  04/05/22 125 lb (56.7 kg)  01/23/22 124 lb (56.2 kg)  01/15/22 124 lb (56.2 kg)      Other studies Reviewed: Additional studies/ records that were reviewed today include: TTE 12/17/21  Review of the above records today demonstrates:   1. No significant change from echo in June 2021.   2. Left ventricular ejection fraction, by estimation, is 60 to 65%. The  left ventricle has normal function. The left ventricle has no regional  wall motion abnormalities. Left ventricular diastolic parameters were  normal.   3. Right ventricular systolic function is normal. The right ventricular  size is normal. There is normal pulmonary artery systolic pressure.   4. A small pericardial effusion is present.   5. MR is late systolic. . Mild to moderate mitral valve regurgitation.  There is moderate prolapse of both leaflets of the mitral valve.   6. The aortic valve is tricuspid. Aortic valve regurgitation is not  visualized.   7. The inferior vena cava is normal in size with greater than 50%  respiratory variability,  suggesting right atrial pressure of 3 mmHg.   Cardiac monitor 01/08/2022 personally reviewed 1. SR/SB/ST 2. Freq PVCs 3. Short runs of SVT and NSVT 4. Occasional 3 sec pauses 5. Needs ROV to discuss  ASSESSMENT AND PLAN:  1.  SVT/PVCs: Has had short episodes of SVT and nonsustained VT on cardiac monitor.  Currently on metoprolol.  She has minimal short episodes of SVT.  I reviewed each triggered episode on the monitor with the patient.  The patient understands that she does not have any major arrhythmia.  We discussed her low PVC burden of 5.8%.  She understands that this is not a dangerous thing.  I have told her that there is nothing dangerous on her monitor.  She feels that this confidence Quanda Pavlicek improve her overall symptoms.  I Kirah Stice see her back on an as-needed  basis.   Current medicines are reviewed at length with the patient today.   The patient does not have concerns regarding her medicines.  The following changes were made today:  none  Labs/ tests ordered today include:  Orders Placed This Encounter  Procedures   EKG 12-Lead     Disposition:   FU with Harlee Eckroth as needed  Signed, Blyss Lugar Meredith Leeds, MD  04/05/2022 3:38 PM     [facility] [address] [place] [address] [phone] (office) [phone] (fax)

## 2022-04-24 ENCOUNTER — Ambulatory Visit
Admission: RE | Admit: 2022-04-24 | Discharge: 2022-04-24 | Disposition: A | Discharge status: Home / Self Care | Source: Ambulatory Visit | Attending: Physician Assistant | Admitting: Physician Assistant

## 2022-04-24 DIAGNOSIS — Z1231 Encounter for screening mammogram for malignant neoplasm of breast: Secondary | ICD-10-CM

## 2022-07-09 NOTE — Progress Notes (Signed)
Cardiology Clinic Note   Patient Name: Kathy Douglas Date of Encounter: 07/10/2022  Primary Care Provider:  Barbarann Ehlers Primary Cardiologist:  Nanetta Batty, MD  Patient Profile    Kathy Douglas 61 year old female presents the clinic today for follow-up evaluation of her palpitations.  Past Medical History    Past Medical History:  Diagnosis Date   Chest pain    a. 09/2013 Cath: nl cors, EF 55-65%.   Hypertension    Mitral valve prolapse    mild to moderate mitral regurgitation, moderate LV dysfunction   Osteopenia 06/2010   T score -1.44 of the hip Followup DEXA 2014 normal.   Refusal of blood transfusions as patient is Jehovah's Witness    Jehovah witness   Simple hepatic cyst    a. incidentally noted on CT 09/2013   Past Surgical History:  Procedure Laterality Date   LEFT HEART CATHETERIZATION WITH CORONARY ANGIOGRAM N/A 10/01/2013   Procedure: LEFT HEART CATHETERIZATION WITH CORONARY ANGIOGRAM;  Surgeon: Peter M Swaziland, MD;  Location: [facility] CATH LAB;  Service: Cardiovascular;  Laterality: N/A;   TUBAL LIGATION  1975   VAGINAL HYSTERECTOMY  11/2007   LAVH,BSO Leiomyomata/Menorrhagia   WISDOM TOOTH EXTRACTION      Allergies  Allergies  Allergen Reactions   Prednisone Nausea Only    History of Present Illness    Kathy Douglas has a PMH of mitral valve prolapse, NSVT, SVT, sinus pause, HTN, and PVCs.  She was seen in the cardiology clinic by Bernadene Person NP-C on 01/23/2022.  During that time she continued to note daily lightheadedness and dizziness.  She reported that her symptoms were very frequent and almost constant.  Her most recent syncopal event was 01/20/2022.  She described episodes of palpitations which she described as heart racing which were associated with increased work of breathing with minimal exertion.  She felt that her symptoms worsened with the initiation of metoprolol.  She denied chest pain.  She indicated that her symptoms were affecting  her quality of life.  Her amlodipine was decreased to 2.5 mg daily.  She was referred to EP for further evaluation.  She was instructed to continue metoprolol during that time.  She was seen in follow-up by Dr. Elberta Fortis on 04/05/2022.  During that time she denied symptoms of chest pain and shortness of breath.  She was tolerating her medications well without side effects.  She did note several episodes of dizziness throughout the day.  Her episodes could occur with laying flat.  They also occurred throughout the day.  She denied any exact abating, deviated factors.  She was able to continue to be somewhat active.  Her blood pressure was noted to be 128/60 with pulse of 97.  Her cardiac event monitor was reviewed which showed episodes of SVT and nonsustained VT.  A triggered episode was reviewed with patient.  She expressed understanding.  Her 5.8 low PVC burden was discussed.  She expressed understanding about her PVCs not being dangerous. She reported that she felt that her improved education about her symptoms was reassuring.  She was instructed to follow-up as needed.  She presents to the clinic today for follow-up evaluation and states she continues to be physically active doing cardiovascular exercise at least 4 times per week.  She does not notice any irregular heartbeats or chest discomfort with her exercise.  She does note occasional slight lightheadedness.  Generally she feels stronger since being seen in the cardiology clinic  last.  She does occasionally notice extra heartbeats that last for seconds to dissipate on their own.  We reviewed triggers for palpitations and she expressed under standing.  We will repeat her echocardiogram in May to evaluate mitral valve prolapse.  I will have her continue her current diet and exercise regimen.  Today she denies chest pain, shortness of breath, lower extremity edema, fatigue, palpitations, melena, hematuria, hemoptysis, diaphoresis, weakness, presyncope,  syncope, orthopnea, and PND.   Home Medications    Prior to Admission medications   Medication Sig Start Date End Date Taking? Authorizing Provider  amLODipine (NORVASC) 5 MG tablet Take 2.5 mg by mouth daily.    [provider]  Cholecalciferol (VITAMIN D3) 1000 UNITS CAPS Take 1,000 Units by mouth daily.    [provider]  meclizine (ANTIVERT) 25 MG tablet Take 1 tablet (25 mg total) by mouth 3 (three) times daily as needed for dizziness. 10/11/20   Dartha Lodge, PA-C  metoprolol succinate (TOPROL XL) 25 MG 24 hr tablet Take 1 tablet (25 mg total) by mouth daily. 12/14/21   Runell Gess, MD  Multiple Vitamins-Iron (MULTIVITAMIN/IRON PO) Take by mouth.      [provider]    Family History    Family History  Problem Relation Age of Onset   Hypertension Mother    Hypertension Father    Heart disease Father        Heart attack at 10 (passed) after surgery   Heart disease Brother        First heart attack age 69, also has CHF   Cancer Brother        Prostate,Blood   Hypertension Brother    Diabetes Paternal Uncle    Hypertension Sister    Diabetes Paternal Aunt    She indicated that her mother is alive. She indicated that her father is deceased. She indicated that the status of her sister is unknown. She indicated that the status of her brother is unknown. She indicated that the status of her paternal aunt is unknown. She indicated that the status of her paternal uncle is unknown.  Social History    Social History   Socioeconomic History   Marital status: Married    Spouse name: Not on file   Number of children: Not on file   Years of education: Not on file   Highest education level: Not on file  Occupational History    Employer: [employer]    Comment: Accounting  Tobacco Use   Smoking status: Never   Smokeless tobacco: Never  Vaping Use   Vaping Use: Never used  Substance and Sexual Activity   Alcohol use: No     Alcohol/week: 0.0 standard drinks of alcohol   Drug use: No   Sexual activity: Not Currently    Comment: 1st intercourse 15 yo-5 partners  Other Topics Concern   Not on file  Social History Narrative   Not on file   Social Determinants of Health   Financial Resource Strain: Not on file  Food Insecurity: Not on file  Transportation Needs: Not on file  Physical Activity: Not on file  Stress: Not on file  Social Connections: Not on file  Intimate Partner Violence: Not on file     Review of Systems    General:  No chills, fever, night sweats or weight changes.  Cardiovascular:  No chest pain, dyspnea on exertion, edema, orthopnea, palpitations, paroxysmal nocturnal dyspnea. Dermatological: No rash, lesions/masses Respiratory:  No cough, dyspnea Urologic: No hematuria, dysuria Abdominal:   No nausea, vomiting, diarrhea, bright red blood per rectum, melena, or hematemesis Neurologic:  No visual changes, wkns, changes in mental status. All other systems reviewed and are otherwise negative except as noted above.  Physical Exam    VS:  BP 128/86   Pulse 96   Ht 5\' 7"  (1.702 m)   Wt 128 lb (58.1 kg)   SpO2 99%   BMI 20.05 kg/m  , BMI Body mass index is 20.05 kg/m. GEN: Well nourished, well developed, in no acute distress. HEENT: normal. Neck: Supple, no JVD, carotid bruits, or masses. Cardiac: RRR, 3/6 systolic murmur heard best along apex, rubs, or gallops. No clubbing, cyanosis, edema.  Radials/DP/PT 2+ and equal bilaterally.  Respiratory:  Respirations regular and unlabored, clear to auscultation bilaterally. GI: Soft, nontender, nondistended, BS + x 4. MS: no deformity or atrophy. Skin: warm and dry, no rash. Neuro:  Strength and sensation are intact. Psych: Normal affect.  Accessory Clinical Findings    Recent Labs: 01/15/2022: TSH 1.290   Recent Lipid Panel    Component Value Date/Time   CHOL 166 08/24/2018 0846   TRIG 43 08/24/2018 0846   HDL 90 08/24/2018  0846   CHOLHDL 1.8 08/24/2018 0846   CHOLHDL 1.7 02/08/2015 1110   VLDL 10 02/08/2015 1110   LDLCALC 67 08/24/2018 0846         ECG personally reviewed by me today-none today.  Echocardiogram 12/17/2021  IMPRESSIONS     1. No significant change from echo in June 2021.   2. Left ventricular ejection fraction, by estimation, is 60 to 65%. The  left ventricle has normal function. The left ventricle has no regional  wall motion abnormalities. Left ventricular diastolic parameters were  normal.   3. Right ventricular systolic function is normal. The right ventricular  size is normal. There is normal pulmonary artery systolic pressure.   4. A small pericardial effusion is present.   5. MR is late systolic. . Mild to moderate mitral valve regurgitation.  There is moderate prolapse of both leaflets of the mitral valve.   6. The aortic valve is tricuspid. Aortic valve regurgitation is not  visualized.   7. The inferior vena cava is normal in size with greater than 50%  respiratory variability, suggesting right atrial pressure of 3 mmHg.   FINDINGS   Left Ventricle: Left ventricular ejection fraction, by estimation, is 60  to 65%. The left ventricle has normal function. The left ventricle has no  regional wall motion abnormalities. The left ventricular internal cavity  size was normal in size. There is   no left ventricular hypertrophy. Left ventricular diastolic parameters  were normal.   Right Ventricle: The right ventricular size is normal. Right vetricular  wall thickness was not assessed. Right ventricular systolic function is  normal. There is normal pulmonary artery systolic pressure. The tricuspid  regurgitant velocity is 2.09 m/s,  and with an assumed right atrial pressure of 5 mmHg, the estimated right  ventricular systolic pressure is 22.5 mmHg.   Left Atrium: Left atrial size was normal in size.   Right Atrium: Right atrial size was normal in size.   Pericardium: A  small pericardial effusion is present.   Mitral Valve: MR is late systolic. There is moderate prolapse of both  leaflets of the mitral valve. There is mild thickening of the mitral valve  leaflet(s). Mild to moderate mitral valve regurgitation.   Tricuspid Valve: The tricuspid  valve is normal in structure. Tricuspid  valve regurgitation is mild.   Aortic Valve: The aortic valve is tricuspid. Aortic valve regurgitation is  not visualized. Aortic valve peak gradient measures 4.8 mmHg.   Pulmonic Valve: The pulmonic valve was grossly normal. Pulmonic valve  regurgitation is not visualized.   Aorta: The aortic root is normal in size and structure.   Venous: The inferior vena cava is normal in size with greater than 50%  respiratory variability, suggesting right atrial pressure of 3 mmHg.   IAS/Shunts: No atrial level shunt detected by color flow Doppler.   Assessment & Plan   1.  Palpitations, PVC, SVT-heart rate today 96.  Tolerating metoprolol well.  Denies side effects.  Was seen and evaluated by EP.  Patient educated about palpitations and was reassured. Continue metoprolol Avoid triggers caffeine, chocolate, EtOH, dehydration etc. Maintain physical activity   Chest discomfort-episodes not bothersome.  Atypical in nature. Continue metoprolol, amlodipine Increase physical activity as tolerated  Essential hypertension, orthostatic hypotension-BP today 128/86.  Denies lightheadedness, presyncope and syncope. Continue metoprolol, amlodipine Heart healthy low-sodium diet Increase physical activity as tolerated   Mitral valve prolapse-no increased activity intolerance.  Echocardiogram 12/17/2021 showed mild-moderate mitral valve regurgitation with moderate prolapse. Recommend repeat echocardiogram 5/24  Disposition: Follow-up with Dr.Berry in 6 months.   Thomasene Ripple. Jemila Camille NP-C     07/10/2022, 3:48 PM [facility] [address] Office  [phone] Fax [phone]    I spent 14 minutes examining this patient, reviewing medications, and using patient centered shared decision making involving her cardiac care.  Prior to her visit I spent greater than 20 minutes reviewing her past medical history,  medications, and prior cardiac tests.

## 2022-07-10 ENCOUNTER — Encounter: Payer: Self-pay | Admitting: General Practice

## 2022-07-10 ENCOUNTER — Ambulatory Visit: Attending: General Practice | Admitting: General Practice

## 2022-07-10 VITALS — BP 128/86 | HR 96 | Ht 67.0 in | Wt 128.0 lb

## 2022-07-10 DIAGNOSIS — I10 Essential (primary) hypertension: Secondary | ICD-10-CM

## 2022-07-10 DIAGNOSIS — R002 Palpitations: Secondary | ICD-10-CM

## 2022-07-10 DIAGNOSIS — I471 Supraventricular tachycardia, unspecified: Secondary | ICD-10-CM | POA: Diagnosis not present

## 2022-07-10 DIAGNOSIS — I493 Ventricular premature depolarization: Secondary | ICD-10-CM | POA: Diagnosis not present

## 2022-07-10 DIAGNOSIS — R0789 Other chest pain: Secondary | ICD-10-CM

## 2022-07-10 DIAGNOSIS — I341 Nonrheumatic mitral (valve) prolapse: Secondary | ICD-10-CM

## 2022-07-10 NOTE — Patient Instructions (Signed)
Medication Instructions:  The current medical regimen is effective;  continue present plan and medications as directed. Please refer to the Current Medication list given to you today.  *If you need a refill on your cardiac medications before your next appointment, please call your pharmacy*  Lab Work: NONE If you have labs (blood work) drawn today and your tests are completely normal, you will receive your results only by:  MyChart Message (if you have MyChart) OR A paper copy in the mail If you have any lab test that is abnormal or we need to change your treatment, we will call you to review the results.  Other Instructions MAINTAIN DIET AND PHYSICAL ACTIVITY  Please try to avoid these triggers: Do not use any products that have nicotine or tobacco in them. These include cigarettes, e-cigarettes, and chewing tobacco. If you need help quitting, ask your doctor. Eat heart-healthy foods. Talk with your doctor about the right eating plan for you. Exercise regularly as told by your doctor. Stay hydrated Do not drink alcohol, Caffeine or chocolate. Lose weight if you are overweight. Do not use drugs, including cannabis  SCHEDULE ECHO IN MAY JUNE  Follow-Up: At [facility], you and your health needs are our priority.  As part of our continuing mission to provide you with exceptional heart care, we have created designated Provider Care Teams.  These Care Teams include your primary Cardiologist (physician) and Advanced Practice Providers (APPs -  Physician Assistants and Nurse Practitioners) who all work together to provide you with the care you need, when you need it.  Your next appointment:   6-9 month(s)  The format for your next appointment:   In Person  Provider:   Nanetta Batty, MD    Important Information About Sugar

## 2022-09-08 ENCOUNTER — Other Ambulatory Visit: Payer: Self-pay | Admitting: Cardiovascular Disease

## 2022-11-27 ENCOUNTER — Ambulatory Visit (HOSPITAL_COMMUNITY): Attending: Cardiology

## 2022-11-27 DIAGNOSIS — I10 Essential (primary) hypertension: Secondary | ICD-10-CM | POA: Diagnosis present

## 2022-11-27 DIAGNOSIS — I341 Nonrheumatic mitral (valve) prolapse: Secondary | ICD-10-CM

## 2022-11-27 DIAGNOSIS — R002 Palpitations: Secondary | ICD-10-CM

## 2022-11-29 LAB — ECHOCARDIOGRAM COMPLETE
Area-P 1/2: 2.55 cm2
MV M vel: 5.39 m/s
MV Peak grad: 116.2 mmHg
MV VTI: 1.79 cm2
S' Lateral: 1.9 cm

## 2023-01-17 ENCOUNTER — Ambulatory Visit: Attending: Cardiovascular Disease | Admitting: Cardiovascular Disease

## 2023-01-17 ENCOUNTER — Encounter: Payer: Self-pay | Admitting: Cardiovascular Disease

## 2023-01-17 VITALS — BP 122/70 | HR 71 | Ht 67.0 in | Wt 130.0 lb

## 2023-01-17 DIAGNOSIS — R0789 Other chest pain: Secondary | ICD-10-CM

## 2023-01-17 DIAGNOSIS — I341 Nonrheumatic mitral (valve) prolapse: Secondary | ICD-10-CM | POA: Diagnosis not present

## 2023-01-17 DIAGNOSIS — R002 Palpitations: Secondary | ICD-10-CM

## 2023-01-17 DIAGNOSIS — I10 Essential (primary) hypertension: Secondary | ICD-10-CM

## 2023-01-17 NOTE — Assessment & Plan Note (Signed)
History of right regurgitation mitral valve prolapse with 2D echo performed 11/27/2022 revealing normal LV systolic function, biatrial enlargement mild to moderate MR with bileaflet prolapse.  Will continue to follow this on annual basis.

## 2023-01-17 NOTE — Assessment & Plan Note (Addendum)
History of essential hypertension blood pressure measured today at 122/70.  He is on metoprolol .

## 2023-01-17 NOTE — Progress Notes (Signed)
01/17/2023 AYISHA POL   July 17, 1961  [id number]  Primary Physician Jordan Hawks, PA-C Primary Cardiologist: Runell Gess MD Nicholes Calamity, [place]  HPI:  Kathy Douglas is a 62 y.o.    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 Merri Brunette for evaluation of murmur and mitral valve prolapse demonstrated on recent 2-D echo. I last saw her in the office 12/14/2021. She has no cardiac risk factors other than family history. Father died of myocardial infarction at age 69 and her brother had an MI at age 51. She's never had a heart attack or stroke. She denies chest pain but does have mild dyspnea. She was hospitalized from 10/01/13 overnight for chest pain and underwent cardiac catheterization by Dr. Swaziland revealing normal coronaries and normal left ventricular function. A 2-D echocardiogram performed 05/17/15 revealed bileaflet mitral valve prolapse, trivial mitral regurgitation and mild LV dysfunction with an EF of 45-50%. Her most recent echo performed 06/11/16 revealed normal LV systolic function with mild mitral valve prolapse and mild MR.    Since I saw her in the office a year ago she is remained stable.  She is very active and exercises 4 days a week doing cardio.  Her palpitations have improved on beta-blocker.  She did have an event monitor performed 01/15/2022 that showed frequent PVCs and short runs of SVT/nonsustained ventricular tachycardia.  Her most recent 2D echo performed 11/27/2022 revealed an EF of 60 to 65% with bileaflet mitral valve prolapse and mild to moderate MR.   Current Meds  Medication Sig   Cholecalciferol (VITAMIN D3) 1000 UNITS CAPS Take 1,000 Units by mouth daily.   meclizine (ANTIVERT) 25 MG tablet Take 1 tablet (25 mg total) by mouth 3 (three) times daily as needed for dizziness.   metoprolol succinate (TOPROL-XL) 25 MG 24 hr  tablet TAKE 1 TABLET(25 MG) BY MOUTH DAILY   Multiple Vitamins-Iron (MULTIVITAMIN/IRON PO) Take by mouth.       Allergies  Allergen Reactions   Prednisone Nausea Only    Social History   Socioeconomic History   Marital status: Married    Spouse name: Not on file   Number of children: Not on file   Years of education: Not on file   Highest education level: Not on file  Occupational History    Employer: [employer]    Comment: Accounting  Tobacco Use   Smoking status: Never   Smokeless tobacco: Never  Vaping Use   Vaping Use: Never used  Substance and Sexual Activity   Alcohol use: No    Alcohol/week: 0.0 standard drinks of alcohol   Drug use: No   Sexual activity: Not Currently    Comment: 1st intercourse 15 yo-5 partners  Other Topics Concern   Not on file  Social History Narrative   Not on file   Social Determinants of Health   Financial Resource Strain: Not on file  Food Insecurity: Not on file  Transportation Needs: Not on file  Physical Activity: Not on file  Stress: Not on file  Social Connections: Not on file  Intimate Partner Violence: Not on file     Review of Systems: General: negative for chills, fever, night sweats or weight changes.  Cardiovascular: negative for chest pain, dyspnea on exertion, edema, orthopnea, palpitations, paroxysmal nocturnal dyspnea or shortness of breath Dermatological: negative  for rash Respiratory: negative for cough or wheezing Urologic: negative for hematuria Abdominal: negative for nausea, vomiting, diarrhea, bright red blood per rectum, melena, or hematemesis Neurologic: negative for visual changes, syncope, or dizziness All other systems reviewed and are otherwise negative except as noted above.    Blood pressure 122/70, pulse 71, height 5\' 7"  (1.702 m), weight 130 lb (59 kg), SpO2 97 %.  General appearance: alert and no distress Neck: no adenopathy, no carotid bruit, no JVD, supple, symmetrical, trachea  midline, and thyroid not enlarged, symmetric, no tenderness/mass/nodules Lungs: clear to auscultation bilaterally Heart: Systolic click consistent with mitral prolapse and holosystolic murmur consistent with mitral regurgitation. Extremities: extremities normal, atraumatic, no cyanosis or edema Pulses: 2+ and symmetric Skin: Skin color, texture, turgor normal. No rashes or lesions Neurologic: Grossly normal  EKG EKG Interpretation  Date/Time:  Friday January 17 2023 15:39:57 EDT Ventricular Rate:  71 PR Interval:  140 QRS Duration: 92 QT Interval:  366 QTC Calculation: 397 R Axis:   28 Text Interpretation: Sinus rhythm with marked sinus arrhythmia When compared with ECG of 11-Oct-2020 13:24, PREVIOUS ECG IS PRESENT Confirmed by Nanetta Batty [phone]) on 01/17/2023 4:13:52 PM    ASSESSMENT AND PLAN:   Mitral valve prolapse History of right regurgitation mitral valve prolapse with 2D echo performed 11/27/2022 revealing normal LV systolic function, biatrial enlargement mild to moderate MR with bileaflet prolapse.  Will continue to follow this on annual basis.  Essential hypertension History of essential hypertension blood pressure measured today at 122/70.  He is on metoprolol .  Palpitations History of palpitations in the past with event monitor performed 01/09/2022 showing frequent PVCs and short runs of SVT and NSVT.  She is on a beta-blocker.  Her palpitations have improved clinically.     Runell Gess MD FACP,FACC,FAHA, [facility] 01/17/2023 4:25 PM

## 2023-01-17 NOTE — Patient Instructions (Signed)
Medication Instructions:  Your physician recommends that you continue on your current medications as directed. Please refer to the Current Medication list given to you today.  *If you need a refill on your cardiac medications before your next appointment, please call your pharmacy*   Testing/Procedures: Your physician has requested that you have an echocardiogram. Echocardiography is a painless test that uses sound waves to create images of your heart. It provides your doctor with information about the size and shape of your heart and how well your heart's chambers and valves are working. This procedure takes approximately one hour. There are no restrictions for this procedure. Please do NOT wear cologne, perfume, aftershave, or lotions (deodorant is allowed). Please arrive 15 minutes prior to your appointment time. To do in May 2025.    Follow-Up: At [facility], you and your health needs are our priority.  As part of our continuing mission to provide you with exceptional heart care, we have created designated Provider Care Teams.  These Care Teams include your primary Cardiologist (physician) and Advanced Practice Providers (APPs -  Physician Assistants and Nurse Practitioners) who all work together to provide you with the care you need, when you need it.  We recommend signing up for the patient portal called "MyChart".  Sign up information is provided on this After Visit Summary.  MyChart is used to connect with patients for Virtual Visits (Telemedicine).  Patients are able to view lab/test results, encounter notes, upcoming appointments, etc.  Non-urgent messages can be sent to your provider as well.   To learn more about what you can do with MyChart, go to [email or web address].    Your next appointment:   6 month(s)  Provider:   Edd Fabian, FNP or Bernadene Person, NP      Then, Nanetta Batty, MD will plan to see you again in 12 month(s).

## 2023-01-17 NOTE — Assessment & Plan Note (Signed)
History of palpitations in the past with event monitor performed 01/09/2022 showing frequent PVCs and short runs of SVT and NSVT.  She is on a beta-blocker.  Her palpitations have improved clinically.

## 2023-01-20 ENCOUNTER — Encounter (HOSPITAL_COMMUNITY): Payer: Self-pay

## 2023-06-09 ENCOUNTER — Other Ambulatory Visit: Payer: Self-pay | Admitting: Physician Assistant

## 2023-06-09 DIAGNOSIS — Z1231 Encounter for screening mammogram for malignant neoplasm of breast: Secondary | ICD-10-CM

## 2023-07-09 ENCOUNTER — Ambulatory Visit
Admission: RE | Admit: 2023-07-09 | Discharge: 2023-07-09 | Disposition: A | Discharge status: Home / Self Care | Source: Ambulatory Visit | Attending: Physician Assistant | Admitting: Physician Assistant

## 2023-07-09 DIAGNOSIS — Z1231 Encounter for screening mammogram for malignant neoplasm of breast: Secondary | ICD-10-CM

## 2023-12-26 ENCOUNTER — Ambulatory Visit (HOSPITAL_COMMUNITY)
Admission: RE | Admit: 2023-12-26 | Discharge: 2023-12-26 | Disposition: A | Discharge status: Home / Self Care | Source: Ambulatory Visit | Attending: Cardiology | Admitting: Cardiology

## 2023-12-26 DIAGNOSIS — I10 Essential (primary) hypertension: Secondary | ICD-10-CM

## 2023-12-26 DIAGNOSIS — R0789 Other chest pain: Secondary | ICD-10-CM | POA: Diagnosis present

## 2023-12-26 DIAGNOSIS — R002 Palpitations: Secondary | ICD-10-CM | POA: Diagnosis present

## 2023-12-26 DIAGNOSIS — I341 Nonrheumatic mitral (valve) prolapse: Secondary | ICD-10-CM | POA: Diagnosis present

## 2023-12-31 LAB — ECHOCARDIOGRAM COMPLETE
Area-P 1/2: 3.03 cm2
MV M vel: 5.2 m/s
MV Peak grad: 108.2 mmHg
S' Lateral: 2.6 cm

## 2024-01-01 ENCOUNTER — Ambulatory Visit: Payer: Self-pay | Admitting: Cardiovascular Disease

## 2024-01-22 NOTE — Telephone Encounter (Signed)
 Patient is returning call.

## 2024-02-03 ENCOUNTER — Ambulatory Visit: Attending: Cardiovascular Disease | Admitting: Cardiovascular Disease

## 2024-02-03 ENCOUNTER — Encounter: Payer: Self-pay | Admitting: Cardiovascular Disease

## 2024-02-03 VITALS — BP 122/80 | HR 75 | Ht 67.0 in | Wt 128.2 lb

## 2024-02-03 DIAGNOSIS — I341 Nonrheumatic mitral (valve) prolapse: Secondary | ICD-10-CM

## 2024-02-03 DIAGNOSIS — I200 Unstable angina: Secondary | ICD-10-CM

## 2024-02-03 DIAGNOSIS — I10 Essential (primary) hypertension: Secondary | ICD-10-CM | POA: Diagnosis not present

## 2024-02-03 DIAGNOSIS — R002 Palpitations: Secondary | ICD-10-CM

## 2024-02-03 NOTE — Assessment & Plan Note (Signed)
 History of mitral valve prolapse with 2D echo performed 12/26/2023 revealing normal LV size and function with severe mitral regurgitation and moderate holosystolic systolic prolapse of both leaflets.  Interestingly, her echo performed 11/27/2022 showed mild to moderate MR.  She is completely asymptomatic.  Will continue to follow her noninvasively on annual basis.

## 2024-02-03 NOTE — Patient Instructions (Signed)
 Medication Instructions:  Your physician recommends that you continue on your current medications as directed. Please refer to the Current Medication list given to you today.  *If you need a refill on your cardiac medications before your next appointment, please call your pharmacy*   Testing/Procedures: Your physician has requested that you have an echocardiogram. Echocardiography is a painless test that uses sound waves to create images of your heart. It provides your doctor with information about the size and shape of your heart and how well your heart's chambers and valves are working. This procedure takes approximately one hour. There are no restrictions for this procedure. Please do NOT wear cologne, perfume, aftershave, or lotions (deodorant is allowed). Please arrive 15 minutes prior to your appointment time.  Please note: We ask at that you not bring children with you during ultrasound (echo/ vascular) testing. Due to room size and safety concerns, children are not allowed in the ultrasound rooms during exams. Our front office staff cannot provide observation of children in our lobby area while testing is being conducted. An adult accompanying a patient to their appointment will only be allowed in the ultrasound room at the discretion of the ultrasound technician under special circumstances. We apologize for any inconvenience. **To do in May 2026**   Follow-Up: At [facility], you and your health needs are our priority.  As part of our continuing mission to provide you with exceptional heart care, our providers are all part of one team.  This team includes your primary Cardiologist (physician) and Advanced Practice Providers or APPs (Physician Assistants and Nurse Practitioners) who all work together to provide you with the care you need, when you need it.  Your next appointment:   12 month(s)  Provider:   Dorn Lesches, MD    We recommend signing up for the patient portal  called MyChart.  Sign up information is provided on this After Visit Summary.  MyChart is used to connect with patients for Virtual Visits (Telemedicine).  Patients are able to view lab/test results, encounter notes, upcoming appointments, etc.  Non-urgent messages can be sent to your provider as well.   To learn more about what you can do with MyChart, go to [email or web address].

## 2024-02-03 NOTE — Progress Notes (Signed)
 02/03/2024 Kathy Douglas   1961-01-07  [id number]  Primary Physician Vaughn Lauraine NOVAK, PA-C Primary Cardiologist: Dorn JINNY Lesches MD Kathy Douglas, [place]  HPI:  Kathy Douglas is a 63 y.o.  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  She is accompanied by her husband Kathy Douglas today.  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 Alberta Sharps for evaluation of murmur and mitral valve prolapse demonstrated on recent 2-D echo. I last saw her in the office 01/17/2023. She has no cardiac risk factors other than family history. Father died of myocardial infarction at age 61 and her brother had an MI at age 24. She's never had a heart attack or stroke. She denies chest pain but does have mild dyspnea. She was hospitalized from 10/01/13 overnight for chest pain and underwent cardiac catheterization by Dr. [place] revealing normal coronaries and normal left ventricular function. A 2-D echocardiogram performed 05/17/15 revealed bileaflet mitral valve prolapse, trivial mitral regurgitation and mild LV dysfunction with an EF of 45-50%. Her most recent echo performed 06/11/16 revealed normal LV systolic function with mild mitral valve prolapse and mild MR.    Since I saw her in the office a year ago she is remained stable.  She is not quite as active as she was a year ago and is not exercising as much.  She mostly complains of fatigue but denies chest pain or shortness of breath.  She did have a 2D echo performed 12/26/2023 that showed normal LV size and function with severe mitral Ernesto Meadows and moderate holosystolic prolapse of both leaflets.  This represents a change compared to her last echo performed 11/27/2022 at which time she had mild to moderate MR.   Current Meds  Medication Sig   meclizine  (ANTIVERT ) 25 MG tablet Take 1 tablet (25 mg total) by mouth 3 (three) times daily as needed for dizziness.   metoprolol  succinate  (TOPROL -XL) 25 MG 24 hr tablet TAKE 1 TABLET(25 MG) BY MOUTH DAILY   Multiple Vitamins-Iron (MULTIVITAMIN/IRON PO) Take by mouth.     triamcinolone cream (KENALOG) 0.1 % Apply topically as needed.     Allergies  Allergen Reactions   Prednisone Nausea Only    Social History   Socioeconomic History   Marital status: Married    Spouse name: Not on file   Number of children: Not on file   Years of education: Not on file   Highest education level: Not on file  Occupational History    Employer: [employer]    Comment: Accounting  Tobacco Use   Smoking status: Never   Smokeless tobacco: Never  Vaping Use   Vaping status: Never Used  Substance and Sexual Activity   Alcohol use: No    Alcohol/week: 0.0 standard drinks of alcohol   Drug use: No   Sexual activity: Not Currently    Comment: 1st intercourse 15 yo-5 partners  Other Topics Concern   Not on file  Social History Narrative   Not on file   Social Drivers of Health   Financial Resource Strain: Low Risk  (09/10/2023)   [facility]   Overall Financial Resource Strain (CARDIA)    Difficulty of Paying Living Expenses: Not hard at all  Food Insecurity: No Food Insecurity (09/10/2023)   [facility] from [facility]   Hunger Vital Sign    Within the past 12 months, you worried  that your food would run out before you got the money to buy more.: Never true    Within the past 12 months, the food you bought just didn't last and you didn't have money to get more.: Never true  Transportation Needs: No Transportation Needs (09/10/2023)   Received from [facility]   PRAPARE - Transportation    Lack of Transportation (Medical): No    Lack of Transportation (Non-Medical): No  Physical Activity: Insufficiently Active (09/10/2023)   Received from [facility]   Exercise Vital Sign    On average, how many days per week do you engage in moderate to strenuous exercise (like a brisk walk)?: 4 days    On average,  how many minutes do you engage in exercise at this level?: 20 min  Stress: No Stress Concern Present (09/10/2023)   Received from [facility] - Occupational Stress Questionnaire    Feeling of Stress : Only a little  Social Connections: Socially Integrated (09/10/2023)   Received from [facility]   Social Network    How would you rate your social network (family, work, friends)?: Good participation with social networks  Intimate Partner Violence: Not At Risk (09/10/2023)   Received from [facility]   HITS    Over the last 12 months how often did your partner physically hurt you?: Never    Over the last 12 months how often did your partner insult you or talk down to you?: Sometimes    Over the last 12 months how often did your partner threaten you with physical harm?: Never    Over the last 12 months how often did your partner scream or curse at you?: Never     Review of Systems: General: negative for chills, fever, night sweats or weight changes.  Cardiovascular: negative for chest pain, dyspnea on exertion, edema, orthopnea, palpitations, paroxysmal nocturnal dyspnea or shortness of breath Dermatological: negative for rash Respiratory: negative for cough or wheezing Urologic: negative for hematuria Abdominal: negative for nausea, vomiting, diarrhea, bright red blood per rectum, melena, or hematemesis Neurologic: negative for visual changes, syncope, or dizziness All other systems reviewed and are otherwise negative except as noted above.    Blood pressure 122/80, pulse 75, height 5' 7 (1.702 m), weight 128 lb 3.2 oz (58.2 kg), SpO2 97%.  General appearance: alert and no distress Neck: no adenopathy, no carotid bruit, no JVD, supple, symmetrical, trachea midline, and thyroid not enlarged, symmetric, no tenderness/mass/nodules Lungs: clear to auscultation bilaterally Heart: 2/6 apical systolic murmur consistent with mitral mitral  regurgitation. Extremities: extremities normal, atraumatic, no cyanosis or edema Pulses: 2+ and symmetric Skin: Skin color, texture, turgor normal. No rashes or lesions Neurologic: Grossly normal  EKG EKG Interpretation Date/Time:  Tuesday February 03 2024 15:49:19 EDT Ventricular Rate:  75 PR Interval:  140 QRS Duration:  92 QT Interval:  372 QTC Calculation: 415 R Axis:   26  Text Interpretation: Normal sinus rhythm Normal ECG When compared with ECG of 17-Jan-2023 15:39, No significant change was found Confirmed by Court Carrier [id number]) on 02/03/2024 3:57:57 PM    ASSESSMENT AND PLAN:   Mitral valve prolapse History of mitral valve prolapse with 2D echo performed 12/26/2023 revealing normal LV size and function with severe mitral regurgitation and moderate holosystolic systolic prolapse of both leaflets.  Interestingly, her echo performed 11/27/2022 showed mild to moderate MR.  She is completely asymptomatic.  Will continue to follow her noninvasively on annual basis.  Essential hypertension History of essential hypertension with blood pressure measured today 122/80.  She is on metoprolol .  Palpitations History of palpitations with event monitor performed 01/15/2022 showing frequent PVCs, short runs of SVT and NSVT.  She is on a beta-blocker.  These have not changed in frequency or severity.     Dorn DOROTHA Lesches MD FACP,FACC,FAHA, [facility] 02/03/2024 4:11 PM

## 2024-02-03 NOTE — Assessment & Plan Note (Signed)
 History of palpitations with event monitor performed 01/15/2022 showing frequent PVCs, short runs of SVT and NSVT.  She is on a beta-blocker.  These have not changed in frequency or severity.

## 2024-02-03 NOTE — Assessment & Plan Note (Signed)
 History of essential hypertension with blood pressure measured today 122/80.  She is on metoprolol .

## 2024-07-16 ENCOUNTER — Emergency Department (HOSPITAL_BASED_OUTPATIENT_CLINIC_OR_DEPARTMENT_OTHER)
Admission: EM | Admit: 2024-07-16 | Discharge: 2024-07-16 | Disposition: A | Discharge status: Home / Self Care | Attending: Emergency Medicine | Admitting: Emergency Medicine

## 2024-07-16 ENCOUNTER — Encounter (HOSPITAL_BASED_OUTPATIENT_CLINIC_OR_DEPARTMENT_OTHER): Payer: Self-pay

## 2024-07-16 ENCOUNTER — Other Ambulatory Visit: Payer: Self-pay

## 2024-07-16 DIAGNOSIS — R519 Headache, unspecified: Secondary | ICD-10-CM | POA: Insufficient documentation

## 2024-07-16 LAB — CBC WITH DIFFERENTIAL/PLATELET
Abs Immature Granulocytes: 0.01 K/uL (ref 0.00–0.07)
Basophils Absolute: 0 K/uL (ref 0.0–0.1)
Basophils Relative: 0 %
Eosinophils Absolute: 0.1 K/uL (ref 0.0–0.5)
Eosinophils Relative: 1 %
HCT: 37.3 % (ref 36.0–46.0)
Hemoglobin: 12.3 g/dL (ref 12.0–15.0)
Immature Granulocytes: 0 %
Lymphocytes Relative: 22 %
Lymphs Abs: 1.1 K/uL (ref 0.7–4.0)
MCH: 29.9 pg (ref 26.0–34.0)
MCHC: 33 g/dL (ref 30.0–36.0)
MCV: 90.8 fL (ref 80.0–100.0)
Monocytes Absolute: 0.2 K/uL (ref 0.1–1.0)
Monocytes Relative: 4 %
Neutro Abs: 3.5 K/uL (ref 1.7–7.7)
Neutrophils Relative %: 73 %
Platelets: 176 K/uL (ref 150–400)
RBC: 4.11 MIL/uL (ref 3.87–5.11)
RDW: 12.8 % (ref 11.5–15.5)
WBC: 4.9 K/uL (ref 4.0–10.5)
nRBC: 0 % (ref 0.0–0.2)

## 2024-07-16 LAB — BASIC METABOLIC PANEL WITH GFR
Anion gap: 9 (ref 5–15)
BUN: 12 mg/dL (ref 8–23)
CO2: 26 mmol/L (ref 22–32)
Calcium: 9.7 mg/dL (ref 8.9–10.3)
Chloride: 105 mmol/L (ref 98–111)
Creatinine, Ser: 0.93 mg/dL (ref 0.44–1.00)
GFR, Estimated: >60 mL/min
Glucose, Bld: 85 mg/dL (ref 70–99)
Potassium: 4.1 mmol/L (ref 3.5–5.1)
Sodium: 141 mmol/L (ref 135–145)

## 2024-07-16 LAB — SEDIMENTATION RATE: Sed Rate: 12 mm/h (ref 0–22)

## 2024-07-16 NOTE — Discharge Instructions (Signed)
 As we discussed, take ibuprofen regularly to see if this helps prevent recurrent headache pain. You can take 600 mg (3 of the over-the-counter strength tablets) every 6 hours. Follow up with your doctor for recheck per already scheduled appointment.   Return to the ED with any new or worsening symptoms at any time.

## 2024-07-16 NOTE — ED Triage Notes (Signed)
 Arrives POV with complaints of increasing intermittent head pain x1 week. No fall or injuries.

## 2024-07-16 NOTE — ED Provider Notes (Addendum)
 " [place] EMERGENCY DEPARTMENT AT [facility] Provider Note   CSN: [id number] Arrival date & time: 07/16/24  1330     Patient presents with: Headache   Kathy Douglas is a 63 y.o. female.   Patient to ED for evaluation of a left sided headache she describes as a sudden, sharp pain in the temple that last for 1-2 seconds and then completely resolves. Her first such HA was a week ago when she had one episode. She states they did not occur everyday at the onset but is now happening 1-2 times per day. No aggravating or alleviating factors. No eye pain, visual change, ear pain or hearing change. No nausea, vomiting. No history of similar symptoms. She is currently asymptomatic.   The history is provided by the patient. No language interpreter was used.  Headache      Prior to Admission medications  Medication Sig Start Date End Date Taking? Authorizing Provider  meclizine  (ANTIVERT ) 25 MG tablet Take 1 tablet (25 mg total) by mouth 3 (three) times daily as needed for dizziness. 10/11/20   Alva Larraine FALCON, PA-C  metoprolol  succinate (TOPROL -XL) 25 MG 24 hr tablet TAKE 1 TABLET(25 MG) BY MOUTH DAILY 09/09/22   Court Dorn PARAS, MD  Multiple Vitamins-Iron (MULTIVITAMIN/IRON PO) Take by mouth.      [provider]  triamcinolone cream (KENALOG) 0.1 % Apply topically as needed. 08/02/21   [provider]    Allergies: Prednisone    Review of Systems  Neurological:  Positive for headaches.    Updated Vital Signs BP 124/64   Pulse 78   Temp 98.2 F (36.8 C)   Resp 18   Ht 5' 7 (1.702 m)   Wt 59 kg   SpO2 100%   BMI 20.36 kg/m   Physical Exam Vitals and nursing note reviewed.  Constitutional:      Appearance: She is well-developed.  HENT:     Head: Normocephalic.  Cardiovascular:     Rate and Rhythm: Normal rate and regular rhythm.     Heart sounds: No murmur heard. Pulmonary:     Effort: Pulmonary effort is normal.     Breath sounds: Normal  breath sounds. No wheezing, rhonchi or rales.  Abdominal:     General: Bowel sounds are normal.     Palpations: Abdomen is soft.     Tenderness: There is no abdominal tenderness. There is no guarding or rebound.  Musculoskeletal:        General: Normal range of motion.     Cervical back: Normal range of motion and neck supple.  Skin:    General: Skin is warm and dry.  Neurological:     General: No focal deficit present.     Mental Status: She is alert and oriented to person, place, and time.     GCS: GCS eye subscore is 4. GCS verbal subscore is 5. GCS motor subscore is 6.     Cranial Nerves: No cranial nerve deficit, dysarthria or facial asymmetry.     Sensory: Sensation is intact.     Motor: No weakness or pronator drift.     Coordination: Finger-Nose-Finger Test normal.     Gait: Gait normal.     (all labs ordered are listed, but only abnormal results are displayed) Labs Reviewed  CBC WITH DIFFERENTIAL/PLATELET  BASIC METABOLIC PANEL WITH GFR  SEDIMENTATION RATE    EKG: None  Radiology: No results found.   Procedures   Medications Ordered in the  ED - No data to display  Clinical Course as of 07/16/24 2000  Fri Jul 16, 2024  1618 Patient with atypical headache as described in HPI. Consider GCA vs trigeminal neuralgia. CBC, Bmet and sed rate pending.  [SU]  1956 Labs, including Sed Rate, are completely normal. The patient continues to be very well appearing and in NAD. Normal neuro exam, unchanged through ED encounter. Discussed discharge home and PCP follow up. Discussed return precautions. All questions answered.  [SU]    Clinical Course User Index [SU] Odell Balls, PA-C                                 Medical Decision Making Amount and/or Complexity of Data Reviewed Labs: ordered.        Final diagnoses:  Acute nonintractable headache, unspecified headache type    ED Discharge Orders     None          Odell Balls, PA-C 07/16/24  1619    Odell Balls, PA-C 07/16/24 2000    Pamella Ozell LABOR, DO 07/22/24 1316  "

## 2024-07-16 NOTE — ED Notes (Signed)
 ED Provider at bedside.

## 2024-07-30 ENCOUNTER — Other Ambulatory Visit: Payer: Self-pay

## 2024-07-30 ENCOUNTER — Ambulatory Visit
Admission: RE | Admit: 2024-07-30 | Discharge: 2024-07-30 | Disposition: A | Discharge status: Home / Self Care | Source: Ambulatory Visit

## 2024-07-30 DIAGNOSIS — Z1231 Encounter for screening mammogram for malignant neoplasm of breast: Secondary | ICD-10-CM
